# Patient Record
Sex: Female | Born: 1970 | Race: Black or African American | Hispanic: No | Marital: Single | Smoking: Current some day smoker
Health system: Southern US, Community
[De-identification: ages and names within clinical notes are randomized; demographics above are authoritative.]

## PROBLEM LIST (undated history)

## (undated) DIAGNOSIS — N83209 Unspecified ovarian cyst, unspecified side: Secondary | ICD-10-CM

## (undated) DIAGNOSIS — Z9289 Personal history of other medical treatment: Secondary | ICD-10-CM

## (undated) DIAGNOSIS — F419 Anxiety disorder, unspecified: Secondary | ICD-10-CM

## (undated) DIAGNOSIS — R011 Cardiac murmur, unspecified: Secondary | ICD-10-CM

## (undated) DIAGNOSIS — I10 Essential (primary) hypertension: Secondary | ICD-10-CM

## (undated) DIAGNOSIS — J45909 Unspecified asthma, uncomplicated: Secondary | ICD-10-CM

## (undated) HISTORY — PX: WISDOM TOOTH EXTRACTION: SHX21

## (undated) HISTORY — PX: ABDOMINAL HYSTERECTOMY: SHX81

## (undated) HISTORY — PX: APPENDECTOMY: SHX54

---

## 2013-04-20 ENCOUNTER — Emergency Department (HOSPITAL_COMMUNITY)
Admission: EM | Admit: 2013-04-20 | Discharge: 2013-04-20 | Disposition: A | Discharge status: Home / Self Care | Payer: Self-pay | Attending: Emergency Medicine | Admitting: Emergency Medicine

## 2013-04-20 ENCOUNTER — Emergency Department (HOSPITAL_COMMUNITY): Payer: Self-pay

## 2013-04-20 ENCOUNTER — Encounter (HOSPITAL_COMMUNITY): Payer: Self-pay | Admitting: Emergency Medicine

## 2013-04-20 DIAGNOSIS — I10 Essential (primary) hypertension: Secondary | ICD-10-CM | POA: Insufficient documentation

## 2013-04-20 DIAGNOSIS — M545 Low back pain, unspecified: Secondary | ICD-10-CM | POA: Insufficient documentation

## 2013-04-20 DIAGNOSIS — R011 Cardiac murmur, unspecified: Secondary | ICD-10-CM | POA: Insufficient documentation

## 2013-04-20 DIAGNOSIS — J45901 Unspecified asthma with (acute) exacerbation: Secondary | ICD-10-CM | POA: Insufficient documentation

## 2013-04-20 DIAGNOSIS — I252 Old myocardial infarction: Secondary | ICD-10-CM | POA: Insufficient documentation

## 2013-04-20 DIAGNOSIS — R071 Chest pain on breathing: Secondary | ICD-10-CM | POA: Insufficient documentation

## 2013-04-20 DIAGNOSIS — R0789 Other chest pain: Secondary | ICD-10-CM

## 2013-04-20 DIAGNOSIS — F17200 Nicotine dependence, unspecified, uncomplicated: Secondary | ICD-10-CM | POA: Insufficient documentation

## 2013-04-20 HISTORY — DX: Cardiac murmur, unspecified: R01.1

## 2013-04-20 HISTORY — DX: Essential (primary) hypertension: I10

## 2013-04-20 HISTORY — DX: Unspecified asthma, uncomplicated: J45.909

## 2013-04-20 LAB — CBC
HCT: 41.8 % (ref 36.0–46.0)
Hemoglobin: 14.3 g/dL (ref 12.0–15.0)
MCHC: 34.2 g/dL (ref 30.0–36.0)
Platelets: 195 10*3/uL (ref 150–400)
RBC: 4.66 MIL/uL (ref 3.87–5.11)
RDW: 13.8 % (ref 11.5–15.5)
WBC: 9.7 10*3/uL (ref 4.0–10.5)

## 2013-04-20 LAB — POCT I-STAT TROPONIN I

## 2013-04-20 LAB — COMPREHENSIVE METABOLIC PANEL
ALT: 7 U/L (ref 0–35)
Albumin: 3.8 g/dL (ref 3.5–5.2)
Alkaline Phosphatase: 65 U/L (ref 39–117)
BUN: 7 mg/dL (ref 6–23)
Chloride: 103 mEq/L (ref 96–112)
Glucose, Bld: 88 mg/dL (ref 70–99)
Potassium: 3.3 mEq/L — ABNORMAL LOW (ref 3.5–5.1)
Sodium: 140 mEq/L (ref 135–145)
Total Bilirubin: 0.3 mg/dL (ref 0.3–1.2)

## 2013-04-20 LAB — D-DIMER, QUANTITATIVE: D-Dimer, Quant: <0.27 ug/mL-FEU (ref 0.00–0.48)

## 2013-04-20 MED ORDER — DEXAMETHASONE SODIUM PHOSPHATE 4 MG/ML IJ SOLN
10.0000 mg | Freq: Once | INTRAMUSCULAR | Status: AC
  Administered 2013-04-20: 10 mg via INTRAVENOUS
  Filled 2013-04-20 (×2): qty 3

## 2013-04-20 MED ORDER — PREDNISONE 20 MG PO TABS: ORAL_TABLET | ORAL | Status: DC

## 2013-04-20 MED ORDER — TRAMADOL HCL 50 MG PO TABS: 50.0000 mg | ORAL_TABLET | Freq: Four times a day (QID) | ORAL | Status: DC | PRN

## 2013-04-20 MED ORDER — ACETAMINOPHEN 325 MG PO TABS
650.0000 mg | ORAL_TABLET | Freq: Once | ORAL | Status: AC
  Administered 2013-04-20: 650 mg via ORAL
  Filled 2013-04-20: qty 2

## 2013-04-20 MED ORDER — METHOCARBAMOL 500 MG PO TABS
1000.0000 mg | ORAL_TABLET | Freq: Once | ORAL | Status: AC
  Administered 2013-04-20: 1000 mg via ORAL
  Filled 2013-04-20: qty 2

## 2013-04-20 MED ORDER — CYCLOBENZAPRINE HCL 10 MG PO TABS
10.0000 mg | ORAL_TABLET | Freq: Once | ORAL | Status: DC
  Filled 2013-04-20: qty 1

## 2013-04-20 MED ORDER — METHOCARBAMOL 500 MG PO TABS: ORAL_TABLET | ORAL | Status: DC

## 2013-04-20 NOTE — ED Provider Notes (Addendum)
CSN: [id number]     Arrival date & time 04/20/13  1307 History   First MD Initiated Contact with Patient 04/20/13 1308     Chief Complaint  Patient presents with  . Chest Pain   (Consider location/radiation/quality/duration/timing/severity/associated sxs/prior Treatment) HPI Patient reports last night she started getting an upper chest pain that has been constant since last night and has not gone away. She states it's aching and stabbing in nature. She states deep breathing and sometimes movement of her arms makes the pain worse. Nothing makes it feel better. She states she has mild shortness of breath. She had a dry cough last night but not today. She states she felt hot during the night and thought maybe she had a fever. She's been having some rhinorrhea that is white and sometimes green. She denies nausea, vomiting, diaphoresis, sore throat. She states her pain about an hour before she came to the ED was a 10 out of 10, after EMS gave her nitroglycerin x3 her pain came down to a 7/10. She states it feels like when she had MI 3 years ago. She reports however when she had her MI no cardiac catheterization was done and she did not have a stent placed. She states they described it as "slight heart attack" and thought it was from stress to to some problems she was having with a grown daughter.  Patient states she moves her month ago for further evaluation of some low back pain that she's been having. She states she is not gotten to see a doctor since she moved here. She states she was given hydrocodone however she doesn't take it "because it doesn't help".  Patient states she thinks her father started having heart disease while he was in his 66s. He is currently deceased. She states her mother has hypertension and breast cancer.   PCP none  Past Medical History  Diagnosis Date  . MI (myocardial infarction)   . Heart murmur   . Asthma   . Hypertension    Past Surgical History  Procedure  Laterality Date  . Appendectomy    . Cesarean section    . Abdominal hysterectomy     History reviewed. No pertinent family history. History  Substance Use Topics  . Smoking status: Current Every Day Smoker -- 0.50 packs/day    Types: Cigarettes  . Smokeless tobacco: Never Used  . Alcohol Use: No   Unemployed Moved from [employer] 1 month ago Denies cocaine use  OB History   Grav Para Term Preterm Abortions TAB SAB Ect Mult Living                 Review of Systems  All other systems reviewed and are negative.    Allergies  Asa Pt also states she is unable to take ibuprofen, advil  Home Medications    Clonidine  Lisinopril HCTZ   BP 131/88  Pulse 71  Temp(Src) 98.8 F (37.1 C) (Oral)  Resp 22  SpO2 97%  Vital signs normal   Physical Exam  Nursing note and vitals reviewed. Constitutional: She is oriented to person, place, and time. She appears well-developed and well-nourished.  Non-toxic appearance. She does not appear ill. No distress.  HENT:  Head: Normocephalic and atraumatic.  Right Ear: External ear normal.  Left Ear: External ear normal.  Nose: Nose normal. No mucosal edema or rhinorrhea.  Mouth/Throat: Oropharynx is clear and moist and mucous membranes are normal. No dental abscesses or uvula swelling.  Eyes: Conjunctivae  and EOM are normal. Pupils are equal, round, and reactive to light.  Neck: Normal range of motion and full passive range of motion without pain. Neck supple.  Cardiovascular: Normal rate, regular rhythm and normal heart sounds.  Exam reveals no gallop and no friction rub.   No murmur heard. Pulmonary/Chest: Effort normal and breath sounds normal. No respiratory distress. She has no wheezes. She has no rhonchi. She has no rales. She exhibits tenderness. She exhibits no crepitus.    Tender to palpation over her upper bilateral chest that reproduces her pain  Abdominal: Soft. Normal appearance and bowel sounds are normal. She exhibits no  distension. There is no tenderness. There is no rebound and no guarding.  Musculoskeletal: Normal range of motion. She exhibits no edema and no tenderness.  Moves all extremities well.   Neurological: She is alert and oriented to person, place, and time. She has normal strength. No cranial nerve deficit.  Skin: Skin is warm, dry and intact. No rash noted. No erythema. No pallor.  Psychiatric: She has a normal mood and affect. Her speech is normal and behavior is normal. Her mood appears not anxious.    ED Course  Procedures (including critical care time)  Medications  cyclobenzaprine (FLEXERIL) tablet 10 mg (10 mg Oral Not Given 04/20/13 1415)  acetaminophen (TYLENOL) tablet 650 mg (650 mg Oral Given 04/20/13 1411)  dexamethasone (DECADRON) injection 10 mg (10 mg Intravenous Given 04/20/13 1411)  methocarbamol (ROBAXIN) tablet 1,000 mg (1,000 mg Oral Given 04/20/13 1506)    Review of the [place] shows patient has had 9 hydrocodone prescriptions written since May. 3 were 4 #60 tablets including October 2. The others were 4 areas amount from 12-20. They were written from the emergency room at [facility], a physician in [place], [place], a physician in [place], [place], a physician from [facility], and a physician from [place], [place].  We were unable to get her hospital records from [facility], the nurse was told she was admitted in 2012 for back pain and medical records is closed on the weekend. Pt states she was admitted for her back and her chest.   PT's pain is better with above medications.   Pt is asking her nurse at discharge to get hydrocodone 10 mg or percocet for her pain. Pt is displaying drug seeking behavior.  Labs Review Results for orders placed during the hospital encounter of 04/20/13  CBC      Result Value Range   WBC 9.7  4.0 - 10.5 K/uL   RBC 4.66  3.87 - 5.11 MIL/uL   Hemoglobin 14.3  12.0 - 15.0  g/dL   HCT 41.3  24.4 - 01.0 %   MCV 89.7  78.0 - 100.0 fL   MCH 30.7  26.0 - 34.0 pg   MCHC 34.2  30.0 - 36.0 g/dL   RDW 27.2  53.6 - 64.4 %   Platelets 195  150 - 400 K/uL  COMPREHENSIVE METABOLIC PANEL      Result Value Range   Sodium 140  135 - 145 mEq/L   Potassium 3.3 (*) 3.5 - 5.1 mEq/L   Chloride 103  96 - 112 mEq/L   CO2 28  19 - 32 mEq/L   Glucose, Bld 88  70 - 99 mg/dL   BUN 7  6 - 23 mg/dL   Creatinine, Ser 0.34  0.50 - 1.10 mg/dL   Calcium 8.8  8.4 - 74.2 mg/dL  Total Protein 7.1  6.0 - 8.3 g/dL   Albumin 3.8  3.5 - 5.2 g/dL   AST 11  0 - 37 U/L   ALT 7  0 - 35 U/L   Alkaline Phosphatase 65  39 - 117 U/L   Total Bilirubin 0.3  0.3 - 1.2 mg/dL   GFR calc non Af Amer >90  >90 mL/min   GFR calc Af Amer >90  >90 mL/min  D-DIMER, QUANTITATIVE      Result Value Range   D-Dimer, Quant <0.27  0.00 - 0.48 ug/mL-FEU  POCT I-STAT TROPONIN I      Result Value Range   Troponin i, poc 0.02  0.00 - 0.08 ng/mL   Comment 3           POCT I-STAT TROPONIN I      Result Value Range   Troponin i, poc 0.02  0.00 - 0.08 ng/mL   Comment 3            Laboratory interpretation all normal except mild hypokalemia      Imaging Review Dg Chest 2 View  04/20/2013   CLINICAL DATA:  Chest pain  EXAM: CHEST  2 VIEW  COMPARISON:  None.  FINDINGS: Lungs are clear. Heart size and pulmonary vascularity are normal. No adenopathy. No pneumothorax. No bone lesions.  IMPRESSION: No abnormality noted.   Electronically Signed   By: Bretta Bang M.D.   On: 04/20/2013 13:59    EKG Interpretation     Ventricular Rate:  79 PR Interval:  133 QRS Duration: 92 QT Interval:  425 QTC Calculation: 487 R Axis:   -7 Text Interpretation:  Sinus rhythm Probable left atrial enlargement LVH with secondary repolarization abnormality Anterior Q waves, possibly due to LVH No old tracing to compare            MDM   1. Chest wall pain     New Prescriptions   METHOCARBAMOL (ROBAXIN) 500 MG  TABLET    Take 1000 mg 4 times a day   PREDNISONE (DELTASONE) 20 MG TABLET    Take 3 po QD x 2d starting tomorrow, then 2 po QD x 3d then 1 po QD x 3d   TRAMADOL (ULTRAM) 50 MG TABLET    Take 1 tablet (50 mg total) by mouth every 6 (six) hours as needed for pain.    Plan discharge   Devoria Albe, MD, Franz Dell, MD 04/20/13 0981  Ward Givens, MD 04/20/13 1914  Ward Givens, MD 04/20/13 7829

## 2013-04-20 NOTE — ED Notes (Signed)
Pt reports to the ED for eval of generalized chest tightness and SOB. Pt was hypertensive PTA but pt reports that she has hx of hypertension and her CP is always high. Pt had 3 nitros PTA. No ASA was given because patient is allergic to ASA. Pt reports the chest tightness feels similar to symptoms she had 3 years ago when she had an MI. Lung sounds clear, skin warm and dry, and resp e/u. Pain 10/10 upon EMS arrival. After 3 nitros pain 7/10. 12 lead was unremarkable per EMS.

## 2013-05-19 ENCOUNTER — Emergency Department (HOSPITAL_COMMUNITY): Payer: Self-pay

## 2013-05-19 ENCOUNTER — Emergency Department (HOSPITAL_COMMUNITY)
Admission: EM | Admit: 2013-05-19 | Discharge: 2013-05-19 | Disposition: A | Discharge status: Home / Self Care | Payer: Self-pay | Attending: Emergency Medicine | Admitting: Emergency Medicine

## 2013-05-19 ENCOUNTER — Encounter (HOSPITAL_COMMUNITY): Payer: Self-pay | Admitting: Emergency Medicine

## 2013-05-19 DIAGNOSIS — R011 Cardiac murmur, unspecified: Secondary | ICD-10-CM | POA: Insufficient documentation

## 2013-05-19 DIAGNOSIS — Y929 Unspecified place or not applicable: Secondary | ICD-10-CM | POA: Insufficient documentation

## 2013-05-19 DIAGNOSIS — IMO0002 Reserved for concepts with insufficient information to code with codable children: Secondary | ICD-10-CM | POA: Insufficient documentation

## 2013-05-19 DIAGNOSIS — S93409A Sprain of unspecified ligament of unspecified ankle, initial encounter: Secondary | ICD-10-CM | POA: Insufficient documentation

## 2013-05-19 DIAGNOSIS — S93401A Sprain of unspecified ligament of right ankle, initial encounter: Secondary | ICD-10-CM

## 2013-05-19 DIAGNOSIS — F17200 Nicotine dependence, unspecified, uncomplicated: Secondary | ICD-10-CM | POA: Insufficient documentation

## 2013-05-19 DIAGNOSIS — Y9389 Activity, other specified: Secondary | ICD-10-CM | POA: Insufficient documentation

## 2013-05-19 DIAGNOSIS — J45909 Unspecified asthma, uncomplicated: Secondary | ICD-10-CM | POA: Insufficient documentation

## 2013-05-19 DIAGNOSIS — I10 Essential (primary) hypertension: Secondary | ICD-10-CM | POA: Insufficient documentation

## 2013-05-19 DIAGNOSIS — I252 Old myocardial infarction: Secondary | ICD-10-CM | POA: Insufficient documentation

## 2013-05-19 DIAGNOSIS — X500XXA Overexertion from strenuous movement or load, initial encounter: Secondary | ICD-10-CM | POA: Insufficient documentation

## 2013-05-19 MED ORDER — OXYCODONE-ACETAMINOPHEN 5-325 MG PO TABS: 2.0000 | ORAL_TABLET | Freq: Four times a day (QID) | ORAL | Status: DC | PRN

## 2013-05-19 NOTE — ED Notes (Signed)
Per pt sts right ankle pain. denies injury. Hx of old injury

## 2013-05-19 NOTE — Progress Notes (Signed)
Orthopedic Tech Progress Note Patient Details:  Jill Wright [birth year] [id number]  Ortho Devices Type of Ortho Device: ASO;Crutches Ortho Device/Splint Location: rle Ortho Device/Splint Interventions: Application   Nikki Dom 05/19/2013, 3:49 PM

## 2013-05-19 NOTE — ED Provider Notes (Signed)
CSN: [id number]     Arrival date & time 05/19/13  1323 History  This chart was scribed for non-physician practitioner, Roxy Horseman, PA-C working with Roney Marion, MD by Greggory Stallion, ED scribe. This patient was seen in room TR04C/TR04C and the patient's care was started at 3:25 PM.   Chief Complaint  Patient presents with  . Ankle Pain   The history is provided by the patient. No language interpreter was used.   HPI Comments: Jill Wright is a 42 y.o. female who presents to the Emergency Department complaining of gradual onset, constant right ankle pain with associated swelling that started 2 days ago. She thinks she twisted her ankle while going down the steps. Pt has used ice and taken Tylenol with no relief. Bearing weight worsens the pain. Pt states she has to walk on her tip toes due to the pain. She has broken her right ankle in the past.    Past Medical History  Diagnosis Date  . MI (myocardial infarction)   . Heart murmur   . Asthma   . Hypertension    Past Surgical History  Procedure Laterality Date  . Appendectomy    . Cesarean section    . Abdominal hysterectomy     History reviewed. No pertinent family history. History  Substance Use Topics  . Smoking status: Current Every Day Smoker -- 0.50 packs/day    Types: Cigarettes  . Smokeless tobacco: Never Used  . Alcohol Use: No   OB History   Grav Para Term Preterm Abortions TAB SAB Ect Mult Living                 Review of Systems A complete 10 system review of systems was obtained and all systems are negative except as noted in the HPI and PMH.   Allergies  Asa  Home Medications   Current Outpatient Rx  Name  Route  Sig  Dispense  Refill  . methocarbamol (ROBAXIN) 500 MG tablet      Take 1000 mg 4 times a day   60 tablet   0   . predniSONE (DELTASONE) 20 MG tablet      Take 3 po QD x 2d starting tomorrow, then 2 po QD x 3d then 1 po QD x 3d   15 tablet   0   . traMADol (ULTRAM) 50 MG  tablet   Oral   Take 1 tablet (50 mg total) by mouth every 6 (six) hours as needed for pain.   16 tablet   0    BP 181/112  Pulse 75  Temp(Src) 97.6 F (36.4 C)  Resp 18  Ht 6\' 1"  (1.854 m)  Wt 240 lb (108.863 kg)  BMI 31.67 kg/m2  SpO2 98%  Physical Exam  Nursing note and vitals reviewed. Constitutional: She is oriented to person, place, and time. She appears well-developed and well-nourished. No distress.  HENT:  Head: Normocephalic and atraumatic.  Eyes: EOM are normal.  Neck: Neck supple. No tracheal deviation present.  Cardiovascular: Normal rate and intact distal pulses.   Brisk capillary refill.   Pulmonary/Chest: Effort normal. No respiratory distress.  Musculoskeletal: Normal range of motion.  Right ankle ROM and strength limited secondary to pain. Mild swelling.  Tenderness to palpation over the ATFL. No bony tenderness, deformity or abnormality.   Neurological: She is alert and oriented to person, place, and time.  Sensation intact.   Skin: Skin is warm and dry.  No erythema, warmth or  signs of infection.  Psychiatric: She has a normal mood and affect. Her behavior is normal.    ED Course  Procedures (including critical care time)  DIAGNOSTIC STUDIES: Oxygen Saturation is 98% on RA, normal by my interpretation.    COORDINATION OF CARE: 3:30 PM-Discussed treatment plan which includes ankle brace, crutches and pain medication with pt at bedside and pt agreed to plan. Advised pt to follow up with orthopedics if symptoms do not resolve.   Labs Review Labs Reviewed - No data to display Imaging Review Dg Ankle Complete Right  05/19/2013   CLINICAL DATA:  History of fall  EXAM: RIGHT ANKLE - COMPLETE 3+ VIEW  COMPARISON:  None.  FINDINGS: There is no evidence of fracture, dislocation, or joint effusion. There is no evidence of arthropathy or other focal bone abnormality. Soft tissues are unremarkable.  IMPRESSION: Negative.   Electronically Signed   By: Salome Holmes M.D.   On: 05/19/2013 14:31    EKG Interpretation   None       MDM   1. Ankle sprain, right, initial encounter    Patient with ankle sprain.  Plain films are negative.  Will give ankle brace, crutches, and pain meds.  Ortho follow-up.  Patient is able to ambulate, but it is painful.   I personally performed the services described in this documentation, which was scribed in my presence. The recorded information has been reviewed and is accurate.    Roxy Horseman, PA-C 05/19/13 [phone]

## 2013-05-22 NOTE — ED Provider Notes (Signed)
Medical screening examination/treatment/procedure(s) were performed by non-physician practitioner and as supervising physician I was immediately available for consultation/collaboration.  EKG Interpretation   None         Roney Marion, MD 05/22/13 [phone]

## 2013-07-14 ENCOUNTER — Encounter (HOSPITAL_COMMUNITY): Payer: Self-pay | Admitting: Emergency Medicine

## 2013-07-14 ENCOUNTER — Emergency Department (HOSPITAL_COMMUNITY)
Admission: EM | Admit: 2013-07-14 | Discharge: 2013-07-14 | Disposition: A | Discharge status: Home / Self Care | Payer: Self-pay | Attending: Emergency Medicine | Admitting: Emergency Medicine

## 2013-07-14 DIAGNOSIS — I252 Old myocardial infarction: Secondary | ICD-10-CM | POA: Insufficient documentation

## 2013-07-14 DIAGNOSIS — G8911 Acute pain due to trauma: Secondary | ICD-10-CM | POA: Insufficient documentation

## 2013-07-14 DIAGNOSIS — J45909 Unspecified asthma, uncomplicated: Secondary | ICD-10-CM | POA: Insufficient documentation

## 2013-07-14 DIAGNOSIS — F17200 Nicotine dependence, unspecified, uncomplicated: Secondary | ICD-10-CM | POA: Insufficient documentation

## 2013-07-14 DIAGNOSIS — M5431 Sciatica, right side: Secondary | ICD-10-CM

## 2013-07-14 DIAGNOSIS — Z8781 Personal history of (healed) traumatic fracture: Secondary | ICD-10-CM | POA: Insufficient documentation

## 2013-07-14 DIAGNOSIS — M5430 Sciatica, unspecified side: Secondary | ICD-10-CM | POA: Insufficient documentation

## 2013-07-14 DIAGNOSIS — M25571 Pain in right ankle and joints of right foot: Secondary | ICD-10-CM

## 2013-07-14 DIAGNOSIS — R011 Cardiac murmur, unspecified: Secondary | ICD-10-CM | POA: Insufficient documentation

## 2013-07-14 DIAGNOSIS — G8929 Other chronic pain: Secondary | ICD-10-CM | POA: Insufficient documentation

## 2013-07-14 DIAGNOSIS — I10 Essential (primary) hypertension: Secondary | ICD-10-CM | POA: Insufficient documentation

## 2013-07-14 DIAGNOSIS — Z79899 Other long term (current) drug therapy: Secondary | ICD-10-CM | POA: Insufficient documentation

## 2013-07-14 DIAGNOSIS — M25579 Pain in unspecified ankle and joints of unspecified foot: Secondary | ICD-10-CM | POA: Insufficient documentation

## 2013-07-14 MED ORDER — CYCLOBENZAPRINE HCL 10 MG PO TABS: 10.0000 mg | ORAL_TABLET | Freq: Two times a day (BID) | ORAL | Status: DC | PRN

## 2013-07-14 MED ORDER — TRAMADOL HCL 50 MG PO TABS
50.0000 mg | ORAL_TABLET | Freq: Once | ORAL | Status: DC
  Filled 2013-07-14: qty 1

## 2013-07-14 MED ORDER — TRAMADOL HCL 50 MG PO TABS: 50.0000 mg | ORAL_TABLET | Freq: Four times a day (QID) | ORAL | Status: DC | PRN

## 2013-07-14 MED ORDER — CYCLOBENZAPRINE HCL 10 MG PO TABS
10.0000 mg | ORAL_TABLET | Freq: Once | ORAL | Status: AC
  Administered 2013-07-14: 10 mg via ORAL
  Filled 2013-07-14: qty 1

## 2013-07-14 NOTE — ED Notes (Signed)
Pt reports she has chronic back pain that started again a couple of days ago. Also has pain to the right ankle.

## 2013-07-14 NOTE — ED Notes (Signed)
Patient states she sprained her right ankle a while ago and usually has to wear a boot on it. The boot she had is broken.

## 2013-07-14 NOTE — Discharge Instructions (Signed)
Arthralgia  Arthralgia is joint pain. A joint is a place where two bones meet. Joint pain can happen for many reasons. The joint can be bruised, stiff, infected, or weak from aging. Pain usually goes away after resting and taking medicine for soreness.   HOME CARE  · Rest the joint as told by your doctor.  · Keep the sore joint raised (elevated) for the first 24 hours.  · Put ice on the joint area.  · Put ice in a plastic bag.  · Place a towel between your skin and the bag.  · Leave the ice on for 15-20 minutes, 03-04 times a day.  · Wear your splint, casting, elastic bandage, or sling as told by your doctor.  · Only take medicine as told by your doctor. Do not take aspirin.  · Use crutches as told by your doctor. Do not put weight on the joint until told to by your doctor.  GET HELP RIGHT AWAY IF:   · You have bruising, puffiness (swelling), or more pain.  · Your fingers or toes turn blue or start to lose feeling (numb).  · Your medicine does not lessen the pain.  · Your pain becomes severe.  · You have a temperature by mouth above 102° F (38.9° C), not controlled by medicine.  · You cannot move or use the joint.  MAKE SURE YOU:   · Understand these instructions.  · Will watch your condition.  · Will get help right away if you are not doing well or get worse.  Document Released: 06/01/2009 Document Revised: 09/05/2011 Document Reviewed: 06/01/2009  [employer]® Patient Information ©2014 [employer].

## 2013-07-14 NOTE — ED Notes (Signed)
Patient has slight swelling to the lateral ankle with decrease in movement due to pain.

## 2013-07-14 NOTE — ED Provider Notes (Signed)
CSN: [id number]     Arrival date & time 07/14/13  1701 History  This chart was scribed for non-physician practitioner Junius FinnerErin O'Malley, PA-C working with Laray AngerKathleen M McManus, DO by Donne Anonayla Curran, ED Scribe. This patient was seen in room TR09C/TR09C and the patient's care was started at 1735.    First MD Initiated Contact with Patient 07/14/13 1735     Chief Complaint  Patient presents with  . Back Pain  . Ankle Pain    The history is provided by the patient. No language interpreter was used.   HPI Comments: Jill CoilMonique L Wright is a 43 y.o. female who presents to the Emergency Department complaining of a year and a half of gradual onset, chronic, intermittent, severe (9/10) lower back pain that radiates down to her right foot with the latest flare up occuring 3 days ago. She also complains of 3 days of gradual onset, intermittent severe right ankle pain. She sprained her right ankle in November and reports the pain has intermittently persisted since. She denies fever, nausea, vomiting, numbness or tingling in her extremities or any other pain. She has not tried any OTC medication. She has not seen an orthopedist for her symptoms.   Past Medical History  Diagnosis Date  . MI (myocardial infarction)   . Heart murmur   . Asthma   . Hypertension    Past Surgical History  Procedure Laterality Date  . Appendectomy    . Cesarean section    . Abdominal hysterectomy     History reviewed. No pertinent family history. History  Substance Use Topics  . Smoking status: Current Every Day Smoker -- 0.50 packs/day    Types: Cigarettes  . Smokeless tobacco: Never Used  . Alcohol Use: No   OB History   Grav Para Term Preterm Abortions TAB SAB Ect Mult Living                 Review of Systems  Constitutional: Negative for fever.  Gastrointestinal: Negative for nausea and vomiting.  Musculoskeletal: Positive for arthralgias and back pain.  Neurological: Negative for numbness.  All other systems  reviewed and are negative.    Allergies  Asa and Ultram  Home Medications   Current Outpatient Rx  Name  Route  Sig  Dispense  Refill  . albuterol (PROVENTIL HFA;VENTOLIN HFA) 108 (90 BASE) MCG/ACT inhaler   Inhalation   Inhale 2 puffs into the lungs every 6 (six) hours as needed for wheezing or shortness of breath.         . cloNIDine (CATAPRES) 0.2 MG tablet   Oral   Take 0.2 mg by mouth 2 (two) times daily.         . cyclobenzaprine (FLEXERIL) 10 MG tablet   Oral   Take 1 tablet (10 mg total) by mouth 2 (two) times daily as needed for muscle spasms.   20 tablet   0   . traMADol (ULTRAM) 50 MG tablet   Oral   Take 1 tablet (50 mg total) by mouth every 6 (six) hours as needed.   15 tablet   0    BP 197/112  Pulse 66  Temp(Src) 98.3 F (36.8 C) (Oral)  Resp 16  SpO2 100%  Physical Exam  Nursing note and vitals reviewed. Constitutional: She is oriented to person, place, and time. She appears well-developed and well-nourished.  HENT:  Head: Normocephalic and atraumatic.  Eyes: EOM are normal.  Neck: Normal range of motion.  Cardiovascular: Normal rate.  Pulmonary/Chest: Effort normal.  Musculoskeletal: Normal range of motion. She exhibits tenderness.  Tender to palpation of right lumbar paraspinal musculature and right buttock. Normal gait. No bony tenderness, step offs or crepitance.   Neurological: She is alert and oriented to person, place, and time.  Skin: Skin is warm and dry.  Psychiatric: She has a normal mood and affect. Her behavior is normal.    ED Course  Procedures (including critical care time) DIAGNOSTIC STUDIES: Oxygen Saturation is 98% on RA, normal by my interpretation.    COORDINATION OF CARE: 6:34 PM Discussed treatment plan which includes Flexeril and Tramadol with pt at bedside and pt agreed to plan. Resource guide given. Advised pt to follow up with orthopedist, and orthopedic referral given.    Labs Review Labs Reviewed - No  data to display Imaging Review No results found.  EKG Interpretation   None       MDM   1. Back pain with right-sided sciatica   2. Right ankle pain    Hx of chronic pain and right ankle fx in Nov. 2014. No ortho f/u as advised. No new injuries. No new trauma. Do not believe imaging needed at this time. Advised pt to f/u with PCP and ortho. ACE wrap placed, work note provided.  I personally performed the services described in this documentation, which was scribed in my presence. The recorded information has been reviewed and is accurate.    Junius Finner, PA-C 07/14/13 2320

## 2013-07-15 NOTE — ED Provider Notes (Signed)
Medical screening examination/treatment/procedure(s) were performed by non-physician practitioner and as supervising physician I was immediately available for consultation/collaboration.  EKG Interpretation   None         Davielle Lingelbach M Shadi Sessler, DO 07/15/13 1145 

## 2013-07-30 ENCOUNTER — Encounter (HOSPITAL_COMMUNITY): Payer: Self-pay | Admitting: Emergency Medicine

## 2013-07-30 ENCOUNTER — Emergency Department (HOSPITAL_COMMUNITY)
Admission: EM | Admit: 2013-07-30 | Discharge: 2013-07-30 | Disposition: A | Discharge status: Home / Self Care | Payer: Self-pay | Attending: Emergency Medicine | Admitting: Emergency Medicine

## 2013-07-30 ENCOUNTER — Emergency Department (HOSPITAL_COMMUNITY): Payer: Self-pay

## 2013-07-30 DIAGNOSIS — I10 Essential (primary) hypertension: Secondary | ICD-10-CM | POA: Insufficient documentation

## 2013-07-30 DIAGNOSIS — Z79899 Other long term (current) drug therapy: Secondary | ICD-10-CM | POA: Insufficient documentation

## 2013-07-30 DIAGNOSIS — Z90710 Acquired absence of both cervix and uterus: Secondary | ICD-10-CM | POA: Insufficient documentation

## 2013-07-30 DIAGNOSIS — F17200 Nicotine dependence, unspecified, uncomplicated: Secondary | ICD-10-CM | POA: Insufficient documentation

## 2013-07-30 DIAGNOSIS — A599 Trichomoniasis, unspecified: Secondary | ICD-10-CM | POA: Insufficient documentation

## 2013-07-30 DIAGNOSIS — R011 Cardiac murmur, unspecified: Secondary | ICD-10-CM | POA: Insufficient documentation

## 2013-07-30 DIAGNOSIS — Z9089 Acquired absence of other organs: Secondary | ICD-10-CM | POA: Insufficient documentation

## 2013-07-30 DIAGNOSIS — J45909 Unspecified asthma, uncomplicated: Secondary | ICD-10-CM | POA: Insufficient documentation

## 2013-07-30 DIAGNOSIS — N83209 Unspecified ovarian cyst, unspecified side: Secondary | ICD-10-CM | POA: Insufficient documentation

## 2013-07-30 DIAGNOSIS — I252 Old myocardial infarction: Secondary | ICD-10-CM | POA: Insufficient documentation

## 2013-07-30 LAB — COMPREHENSIVE METABOLIC PANEL
ALT: 8 U/L (ref 0–35)
AST: 11 U/L (ref 0–37)
Albumin: 3.5 g/dL (ref 3.5–5.2)
Alkaline Phosphatase: 63 U/L (ref 39–117)
BUN: 9 mg/dL (ref 6–23)
CO2: 23 mEq/L (ref 19–32)
Calcium: 8.5 mg/dL (ref 8.4–10.5)
Chloride: 102 mEq/L (ref 96–112)
Creatinine, Ser: 0.48 mg/dL — ABNORMAL LOW (ref 0.50–1.10)
GFR calc non Af Amer: >90 mL/min (ref >90)
GLUCOSE: 116 mg/dL — AB (ref 70–99)
Potassium: 3.6 mEq/L — ABNORMAL LOW (ref 3.7–5.3)
SODIUM: 139 meq/L (ref 137–147)
TOTAL PROTEIN: 7 g/dL (ref 6.0–8.3)
Total Bilirubin: <0.2 mg/dL — ABNORMAL LOW (ref 0.3–1.2)

## 2013-07-30 LAB — URINALYSIS, ROUTINE W REFLEX MICROSCOPIC
Bilirubin Urine: NEGATIVE
Glucose, UA: NEGATIVE mg/dL
Ketones, ur: NEGATIVE mg/dL
NITRITE: NEGATIVE
Protein, ur: 100 mg/dL — AB
SPECIFIC GRAVITY, URINE: 1.026 (ref 1.005–1.030)
UROBILINOGEN UA: 1 mg/dL (ref 0.0–1.0)
pH: 6 (ref 5.0–8.0)

## 2013-07-30 LAB — URINE MICROSCOPIC-ADD ON

## 2013-07-30 LAB — CBC WITH DIFFERENTIAL/PLATELET
Basophils Absolute: 0.1 10*3/uL (ref 0.0–0.1)
Basophils Relative: 1 % (ref 0–1)
EOS ABS: 0.2 10*3/uL (ref 0.0–0.7)
EOS PCT: 2 % (ref 0–5)
HCT: 40.9 % (ref 36.0–46.0)
Hemoglobin: 14.2 g/dL (ref 12.0–15.0)
LYMPHS ABS: 3.5 10*3/uL (ref 0.7–4.0)
Lymphocytes Relative: 35 % (ref 12–46)
MCH: 30.8 pg (ref 26.0–34.0)
MCHC: 34.7 g/dL (ref 30.0–36.0)
MCV: 88.7 fL (ref 78.0–100.0)
Monocytes Absolute: 0.7 10*3/uL (ref 0.1–1.0)
Monocytes Relative: 7 % (ref 3–12)
Neutro Abs: 5.6 10*3/uL (ref 1.7–7.7)
Neutrophils Relative %: 55 % (ref 43–77)
PLATELETS: 208 10*3/uL (ref 150–400)
RBC: 4.61 MIL/uL (ref 3.87–5.11)
RDW: 13.6 % (ref 11.5–15.5)
WBC: 10.1 10*3/uL (ref 4.0–10.5)

## 2013-07-30 LAB — LIPASE, BLOOD: LIPASE: 95 U/L — AB (ref 11–59)

## 2013-07-30 MED ORDER — OXYCODONE-ACETAMINOPHEN 5-325 MG PO TABS: 1.0000 | ORAL_TABLET | Freq: Four times a day (QID) | ORAL | Status: DC | PRN

## 2013-07-30 MED ORDER — ONDANSETRON HCL 4 MG/2ML IJ SOLN
4.0000 mg | Freq: Once | INTRAMUSCULAR | Status: AC
  Administered 2013-07-30: 4 mg via INTRAVENOUS
  Filled 2013-07-30: qty 2

## 2013-07-30 MED ORDER — ONDANSETRON HCL 4 MG PO TABS: 4.0000 mg | ORAL_TABLET | Freq: Four times a day (QID) | ORAL | Status: DC

## 2013-07-30 MED ORDER — HYDROMORPHONE HCL PF 1 MG/ML IJ SOLN
1.0000 mg | Freq: Once | INTRAMUSCULAR | Status: AC
  Administered 2013-07-30: 1 mg via INTRAVENOUS
  Filled 2013-07-30: qty 1

## 2013-07-30 NOTE — ED Provider Notes (Signed)
CSN: [id number]     Arrival date & time 07/30/13  0110 History   None    Chief Complaint  Patient presents with  . Abdominal Pain   (Consider location/radiation/quality/duration/timing/severity/associated sxs/prior Treatment) HPI Pt presents with right lower abdominal pain that radiates to her right flank.  She states pain started yesterday.  Denies dysuria, no increased urgency or frequency.  No fever/chills. Has had some nausea and vomiting.  Pt has prior appendectomy and hysterectomy.  No prior pain similar to this.  Has not tried anything for the pain.  Pain worse with palpation, worse with lying flat.  No vaginal bleeding.  There are no other associated systemic symptoms, there are no other alleviating or modifying factors.   Past Medical History  Diagnosis Date  . MI (myocardial infarction)   . Heart murmur   . Asthma   . Hypertension    Past Surgical History  Procedure Laterality Date  . Appendectomy    . Cesarean section    . Abdominal hysterectomy     No family history on file. History  Substance Use Topics  . Smoking status: Current Every Day Smoker -- 0.50 packs/day    Types: Cigarettes  . Smokeless tobacco: Never Used  . Alcohol Use: No   OB History   Grav Para Term Preterm Abortions TAB SAB Ect Mult Living                 Review of Systems ROS reviewed and all otherwise negative except for mentioned in HPI  Allergies  Asa and Ultram  Home Medications   Current Outpatient Rx  Name  Route  Sig  Dispense  Refill  . albuterol (PROVENTIL HFA;VENTOLIN HFA) 108 (90 BASE) MCG/ACT inhaler   Inhalation   Inhale 2 puffs into the lungs every 6 (six) hours as needed for wheezing or shortness of breath.         . cloNIDine (CATAPRES) 0.2 MG tablet   Oral   Take 0.2 mg by mouth 2 (two) times daily.         . cyclobenzaprine (FLEXERIL) 10 MG tablet   Oral   Take 1 tablet (10 mg total) by mouth 2 (two) times daily as needed for muscle spasms.   20 tablet    0   . traMADol (ULTRAM) 50 MG tablet   Oral   Take 1 tablet (50 mg total) by mouth every 6 (six) hours as needed.   15 tablet   0   . ondansetron (ZOFRAN) 4 MG tablet   Oral   Take 1 tablet (4 mg total) by mouth every 6 (six) hours.   12 tablet   0   . oxyCODONE-acetaminophen (PERCOCET/ROXICET) 5-325 MG per tablet   Oral   Take 1-2 tablets by mouth every 6 (six) hours as needed for severe pain.   15 tablet   0    BP 159/98  Pulse 66  Temp(Src) 97.8 F (36.6 C) (Oral)  Resp 18  Ht 6\' 1"  (1.854 m)  Wt 230 lb (104.327 kg)  BMI 30.35 kg/m2  SpO2 92% Vitals reviewed Physical Exam Physical Examination: General appearance - alert, well appearing, and in no distress Mental status - alert, oriented to person, place, and time Eyes - no conjunctival injection, no scleral icterus Mouth - mucous membranes moist, pharynx normal without lesions Chest - clear to auscultation, no wheezes, rales or rhonchi, symmetric air entry Heart - normal rate, regular rhythm, normal S1, S2, no murmurs, rubs, clicks or  gallops Abdomen - soft, ttp in right lower abdomen, no gaurding or rebound tenderness, nondistended, no masses or organomegaly Extremities - peripheral pulses normal, no pedal edema, no clubbing or cyanosis Skin - normal coloration and turgor, no rashes   ED Course  Procedures (including critical care time) Labs Review Labs Reviewed  COMPREHENSIVE METABOLIC PANEL - Abnormal; Notable for the following:    Potassium 3.6 (*)    Glucose, Bld 116 (*)    Creatinine, Ser 0.48 (*)    Total Bilirubin <0.2 (*)    All other components within normal limits  LIPASE, BLOOD - Abnormal; Notable for the following:    Lipase 95 (*)    All other components within normal limits  URINALYSIS, ROUTINE W REFLEX MICROSCOPIC - Abnormal; Notable for the following:    APPearance CLOUDY (*)    Hgb urine dipstick MODERATE (*)    Protein, ur 100 (*)    Leukocytes, UA MODERATE (*)    All other components  within normal limits  URINE MICROSCOPIC-ADD ON - Abnormal; Notable for the following:    Squamous Epithelial / LPF FEW (*)    Bacteria, UA FEW (*)    All other components within normal limits  URINE CULTURE  CBC WITH DIFFERENTIAL   Imaging Review Ct Abdomen Pelvis Wo Contrast  07/30/2013   CLINICAL DATA:  Right lower quadrant pain.  EXAM: CT ABDOMEN AND PELVIS WITHOUT CONTRAST  TECHNIQUE: Multidetector CT imaging of the abdomen and pelvis was performed following the standard protocol without intravenous contrast.  COMPARISON:  None.  FINDINGS: BODY WALL: Unremarkable.  LOWER CHEST: Unremarkable.  ABDOMEN/PELVIS:  Liver: No focal abnormality.  Biliary: No evidence of biliary obstruction or stone.  Pancreas: Unremarkable.  Spleen: Unremarkable.  Adrenals: Unremarkable.  Kidneys and ureters: 2 13 mm low dense renal lesions on the right. There are 2 similarly sized low dense renal lesions on the left, located in the upper and interpolar kidney. No hydronephrosis or stone.  Bladder: Unremarkable.  Reproductive: There is a 6 cm diameter mass in the right adnexa which has fluid hematocrit levels. The neighboring ovarian parenchyma does not appear thickened/edematous. Hysterectomy.  Bowel: No obstruction. Appendectomy.  Retroperitoneum: No mass or adenopathy.  Peritoneum: No free fluid or gas.  Vascular: No acute abnormality.  OSSEOUS: No acute abnormalities.  IMPRESSION: 1. 6 cm hemorrhagic cyst in the right adnexa. Short-interval follow up ultrasound in 6-12 weeks is recommended to confirm and document clearing. 2. Hysterectomy. 3. Bilateral renal cysts.   Electronically Signed   By: Tiburcio PeaJonathan  Watts M.D.   On: 07/30/2013 04:34    EKG Interpretation   None       MDM   1. Hemorrhagic ovarian cyst   2. Trichomonas infection    Pt presenting with right lower abdominal pain, pt s/p appendectomy and hysterectomy.  Urinalysis with some WBCs but appears contaminated.  Does show signs of trich.  Abdominal CT  scan to evaluated for stone, shows hemorrhagic ovarian cyst.  Pt feels improved after morphine.  Discharged with strict return precautions.  Pt agreeable with plan.    Ethelda ChickMartha K Linker, MD 07/30/13 [phone]

## 2013-07-30 NOTE — ED Notes (Signed)
Pt arrives via EMS c/o LRQ beginning 07/28/13. Pt unable to describe pain. Increases on palpation. Laying down makes it worse. Apendix has been removed. No pain on urination. Hx untreated HTN. 174/86 92.

## 2013-07-30 NOTE — ED Notes (Signed)
Pt taken to CT.

## 2013-07-30 NOTE — ED Notes (Signed)
Pt back from CT

## 2013-07-30 NOTE — Discharge Instructions (Signed)
Return to the ED with any concerns including fever/chills, vomiting and not able to keep down liquids, pain not controlled by pain medications, decreased level of alertness/lethargy, or any other alarming symptoms  It is important that you arrange to have a pelvic ultrasound performed in approx 6 weeks to ensure proper resolution of the ovarian cyst

## 2013-07-31 LAB — URINE CULTURE: Colony Count: 9000

## 2013-08-03 ENCOUNTER — Inpatient Hospital Stay (HOSPITAL_COMMUNITY): Payer: Self-pay

## 2013-08-03 ENCOUNTER — Inpatient Hospital Stay (HOSPITAL_COMMUNITY)
Admission: AD | Admit: 2013-08-03 | Discharge: 2013-08-03 | Disposition: A | Discharge status: Home / Self Care | Payer: Medicaid Other | Source: Ambulatory Visit | Attending: Obstetrics & Gynecology | Admitting: Obstetrics & Gynecology

## 2013-08-03 ENCOUNTER — Encounter (HOSPITAL_COMMUNITY): Payer: Self-pay | Admitting: *Deleted

## 2013-08-03 DIAGNOSIS — N83209 Unspecified ovarian cyst, unspecified side: Secondary | ICD-10-CM | POA: Insufficient documentation

## 2013-08-03 DIAGNOSIS — Z90711 Acquired absence of uterus with remaining cervical stump: Secondary | ICD-10-CM | POA: Insufficient documentation

## 2013-08-03 DIAGNOSIS — N83201 Unspecified ovarian cyst, right side: Secondary | ICD-10-CM

## 2013-08-03 LAB — URINE MICROSCOPIC-ADD ON

## 2013-08-03 LAB — URINALYSIS, ROUTINE W REFLEX MICROSCOPIC
Bilirubin Urine: NEGATIVE
Glucose, UA: NEGATIVE mg/dL
Ketones, ur: NEGATIVE mg/dL
LEUKOCYTES UA: NEGATIVE
Nitrite: NEGATIVE
PROTEIN: 30 mg/dL — AB
Specific Gravity, Urine: 1.025 (ref 1.005–1.030)
Urobilinogen, UA: 0.2 mg/dL (ref 0.0–1.0)
pH: 6 (ref 5.0–8.0)

## 2013-08-03 LAB — CBC
HCT: 39.8 % (ref 36.0–46.0)
Hemoglobin: 13.3 g/dL (ref 12.0–15.0)
MCH: 29.6 pg (ref 26.0–34.0)
MCHC: 33.4 g/dL (ref 30.0–36.0)
MCV: 88.4 fL (ref 78.0–100.0)
PLATELETS: 182 10*3/uL (ref 150–400)
RBC: 4.5 MIL/uL (ref 3.87–5.11)
RDW: 13.4 % (ref 11.5–15.5)
WBC: 12.5 10*3/uL — AB (ref 4.0–10.5)

## 2013-08-03 LAB — POCT PREGNANCY, URINE: Preg Test, Ur: NEGATIVE

## 2013-08-03 MED ORDER — OXYCODONE-ACETAMINOPHEN 5-325 MG PO TABS
1.0000 | ORAL_TABLET | Freq: Once | ORAL | Status: AC
  Administered 2013-08-03: 1 via ORAL
  Filled 2013-08-03: qty 1

## 2013-08-03 MED ORDER — OXYCODONE-ACETAMINOPHEN 5-325 MG PO TABS: 2.0000 | ORAL_TABLET | ORAL | Status: DC | PRN

## 2013-08-03 NOTE — Discharge Instructions (Signed)

## 2013-08-03 NOTE — MAU Note (Signed)
Patient presents stating that she went to the ER at [facility] Tuesday night and was diagnosed with an ovarian cyst. Patient called for an appt at the clinic but cannot be seen until March. Was instructed that if she felt any worse, to come to MAU.

## 2013-08-03 NOTE — MAU Note (Signed)
Patient states she had a "partial" hysterectomy and takes BP meds.

## 2013-08-04 LAB — URINE CULTURE
COLONY COUNT: NO GROWTH
CULTURE: NO GROWTH

## 2013-08-14 ENCOUNTER — Ambulatory Visit: Payer: Self-pay

## 2013-08-29 ENCOUNTER — Encounter: Payer: Self-pay | Admitting: Obstetrics & Gynecology

## 2013-08-29 ENCOUNTER — Ambulatory Visit (INDEPENDENT_AMBULATORY_CARE_PROVIDER_SITE_OTHER): Payer: Self-pay | Admitting: Obstetrics & Gynecology

## 2013-08-29 VITALS — BP 140/93 | HR 74 | Temp 97.5°F | Ht 73.0 in | Wt 227.6 lb

## 2013-08-29 DIAGNOSIS — N83209 Unspecified ovarian cyst, unspecified side: Secondary | ICD-10-CM

## 2013-08-29 MED ORDER — OXYCODONE HCL 5 MG PO TABS: 5.0000 mg | ORAL_TABLET | ORAL | Status: DC | PRN

## 2013-08-29 NOTE — Progress Notes (Signed)
   CLINIC ENCOUNTER NOTE  History:  43 y.o. [id number] here today for follow up of hemorrhagic ovarian cyst. Still reports significant constant pain. She is allergic to several medications, has been taking Percocet and Acetaminophen. No N/V or other symptoms.  The following portions of the patient's history were reviewed and updated as appropriate: allergies, current medications, past family history, past medical history, past social history, past surgical history and problem list.  Review of Systems:  Pertinent items are noted in HPI.  Objective:  Physical Exam BP 140/93  Pulse 74  Temp(Src) 97.5 F (36.4 C) (Oral)  Ht 6\' 1"  (1.854 m)  Wt 227 lb 9.6 oz (103.239 kg)  BMI 30.03 kg/m2 Gen: NAD Abd: Soft, mild diffuse tenderness in lower abdomen Pelvic: Deferred  Labs and Imaging 08/03/2013 TRANSABDOMINAL AND TRANSVAGINAL ULTRASOUND OF PELVIS    CLINICAL DATA:  Ovarian cyst:  TECHNIQUE: Both transabdominal and transvaginal ultrasound examinations of the pelvis were performed. Transabdominal technique was performed for global imaging of the pelvis including uterus, ovaries, adnexal regions, and pelvic cul-de-sac. It was necessary to proceed with endovaginal exam following the transabdominal exam to visualize the ovaries.  COMPARISON:  CT ABD/PELV WO CM dated 07/30/2013  FINDINGS: Uterus  Measurements: Surgically absent. No central pelvic mass  Endometrium  Thickness: Surgically absent.  Right ovary  Measurements: 7.4 x 4.6 x 5.3 cm. Complex hypoechoic mass within the right ovary measuring 6.3 x 4.5 x 3.5 cm. This appears to be mixed cystic and solid although the solid components could potentially reflect blood products/clot. This could reflect a hemorrhagic cyst, but warrants followup.  Left ovary  Measurements: 3.7 x 1.8 x 2.7 cm. Normal appearance/no adnexal mass.  Other findings  Small amount of free fluid in the pelvis  IMPRESSION: Complex hypoechoic 6.3 cm mass in the right ovary. This may reflect  a hemorrhagic cyst, but warrants followup. Recommend repeat pelvic ultrasound in 1-2 cycles to ensure improvement/resolution.   Electronically Signed   By: Charlett NoseKevin  Dover M.D.   On: 08/03/2013 14:19    Assessment & Plan:  Repeat ultrasound ordered; will follow up results and manage accordingly. Patient given Oxycodone IR; warned about dangers of liver damage with excessive acetaminophen use Torsion precautions discussed. Follow up with PCP regarding elevated BP;  has extensive heart disease history   Jaynie CollinsUGONNA  Alireza Pollack, MD, FACOG Attending Obstetrician & Gynecologist Faculty Practice, [facility]

## 2013-08-29 NOTE — Patient Instructions (Signed)

## 2013-08-30 LAB — CA 125: CA 125: 27.1 U/mL (ref 0.0–30.2)

## 2013-08-31 ENCOUNTER — Inpatient Hospital Stay (HOSPITAL_COMMUNITY)
Admission: AD | Admit: 2013-08-31 | Discharge: 2013-08-31 | Disposition: A | Discharge status: Home / Self Care | Payer: Self-pay | Source: Ambulatory Visit | Attending: Obstetrics & Gynecology | Admitting: Obstetrics & Gynecology

## 2013-08-31 ENCOUNTER — Inpatient Hospital Stay (HOSPITAL_COMMUNITY): Payer: Self-pay

## 2013-08-31 DIAGNOSIS — N83209 Unspecified ovarian cyst, unspecified side: Secondary | ICD-10-CM | POA: Insufficient documentation

## 2013-08-31 DIAGNOSIS — F17200 Nicotine dependence, unspecified, uncomplicated: Secondary | ICD-10-CM | POA: Insufficient documentation

## 2013-08-31 DIAGNOSIS — I10 Essential (primary) hypertension: Secondary | ICD-10-CM | POA: Insufficient documentation

## 2013-08-31 LAB — URINALYSIS, ROUTINE W REFLEX MICROSCOPIC
BILIRUBIN URINE: NEGATIVE
Glucose, UA: NEGATIVE mg/dL
Ketones, ur: NEGATIVE mg/dL
Leukocytes, UA: NEGATIVE
Nitrite: NEGATIVE
PH: 6 (ref 5.0–8.0)
Protein, ur: NEGATIVE mg/dL
Specific Gravity, Urine: 1.025 (ref 1.005–1.030)
Urobilinogen, UA: 0.2 mg/dL (ref 0.0–1.0)

## 2013-08-31 LAB — URINE MICROSCOPIC-ADD ON

## 2013-08-31 MED ORDER — ONDANSETRON 8 MG PO TBDP
8.0000 mg | ORAL_TABLET | Freq: Three times a day (TID) | ORAL | Status: DC | PRN
Start: 2013-08-31 — End: 2013-09-14

## 2013-08-31 MED ORDER — OXYCODONE-ACETAMINOPHEN 5-325 MG PO TABS: 2.0000 | ORAL_TABLET | ORAL | Status: DC | PRN

## 2013-08-31 MED ORDER — OXYCODONE-ACETAMINOPHEN 5-325 MG PO TABS
2.0000 | ORAL_TABLET | Freq: Once | ORAL | Status: AC
  Administered 2013-08-31: 2 via ORAL
  Filled 2013-08-31: qty 2

## 2013-08-31 MED ORDER — PROMETHAZINE HCL 25 MG PO TABS: 25.0000 mg | ORAL_TABLET | Freq: Four times a day (QID) | ORAL | Status: DC | PRN

## 2013-08-31 NOTE — MAU Note (Signed)
Pt states has cyst on r ovary. Was seen in Clinic downstairs and was to have u/s and discuss possible surgery. Taking 2 percocet with no relief. Rates pain 10/10.

## 2013-08-31 NOTE — MAU Note (Signed)
43 yo, hx significant partial hysterectomy, R ovarian cyst. She was seen at clinic on Thursday; pain persist, unrelieved by meds. Denies fever, chills, n/v, bloating. Bowel habits normal.

## 2013-08-31 NOTE — MAU Provider Note (Signed)
History     CSN: [id number]  Arrival date and time: 08/31/13 1026   First Provider Initiated Contact with Patient 08/31/13 1117      Chief Complaint  Patient presents with  . Ovarian Cyst   HPI RN note: 43 yo, hx significant partial hysterectomy, R ovarian cyst. She was seen at clinic on Thursday; pain persist, unrelieved by meds. Denies fever, chills, n/v, bloating. Bowel habits normal  Pt took 2 Oxycodone this morning without relief of pain; pt has not been sleeping since Tuesday due to pain. Pt is scheduled for another ultrasound ~next week and to follow up in clinic to discuss surgery options. Pt states she had Percocet when she was here before which relieved her pain better than oxycodone.   Past Medical History  Diagnosis Date  . MI (myocardial infarction)   . Heart murmur   . Asthma   . Hypertension     Past Surgical History  Procedure Laterality Date  . Appendectomy    . Cesarean section    . Abdominal hysterectomy      Family History  Problem Relation Age of Onset  . Cancer Mother   . Hypertension Mother   . Heart disease Father   . Stroke Father   . Arthritis Daughter   . Seizures Son   . Hypertension Maternal Grandmother   . Arthritis Daughter     History  Substance Use Topics  . Smoking status: Current Every Day Smoker -- 0.50 packs/day    Types: Cigarettes  . Smokeless tobacco: Never Used  . Alcohol Use: No    Allergies:  Allergies  Allergen Reactions  . Asa [Aspirin] Hives and Swelling  . Ultram [Tramadol] Nausea And Vomiting    Prescriptions prior to admission  Medication Sig Dispense Refill  . acetaminophen (TYLENOL) 500 MG tablet Take 500 mg by mouth every 6 (six) hours as needed.      Marland Kitchen. albuterol (PROVENTIL HFA;VENTOLIN HFA) 108 (90 BASE) MCG/ACT inhaler Inhale 2 puffs into the lungs every 6 (six) hours as needed for wheezing or shortness of breath.      . cloNIDine (CATAPRES) 0.2 MG tablet Take 0.2 mg by mouth 2 (two) times daily.       Marland Kitchen. oxyCODONE (OXY IR/ROXICODONE) 5 MG immediate release tablet Take 1 tablet (5 mg total) by mouth every 4 (four) hours as needed for severe pain.  30 tablet  0  . oxyCODONE-acetaminophen (PERCOCET/ROXICET) 5-325 MG per tablet Take 1-2 tablets by mouth every 6 (six) hours as needed for severe pain.  15 tablet  0  . oxyCODONE-acetaminophen (PERCOCET/ROXICET) 5-325 MG per tablet Take 2 tablets by mouth every 4 (four) hours as needed for severe pain.  15 tablet  0    Review of Systems  Constitutional: Negative for fever and chills.  Gastrointestinal: Positive for nausea and abdominal pain. Negative for vomiting, diarrhea and constipation.  Genitourinary: Negative for dysuria and urgency.   Physical Exam   Blood pressure 140/90, pulse 73, temperature 98.1 F (36.7 C), temperature source Oral, resp. rate 14, height 6\' 1"  (1.854 m), weight 101.662 kg (224 lb 2 oz).  Physical Exam  Nursing note and vitals reviewed. Constitutional: She is oriented to person, place, and time. She appears well-developed and well-nourished. She appears distressed.  HENT:  Head: Normocephalic.  Eyes: Pupils are equal, round, and reactive to light.  Neck: Normal range of motion.  Cardiovascular: Normal rate.   Respiratory: Effort normal.  GI: Soft. There is tenderness. There is  no rebound.  Musculoskeletal: Normal range of motion.  Neurological: She is alert and oriented to person, place, and time.  Skin: Skin is warm and dry.  Psychiatric: She has a normal mood and affect.    MAU Course  Procedures Results for orders placed during the hospital encounter of 08/31/13 (from the past 24 hour(s))  URINALYSIS, ROUTINE W REFLEX MICROSCOPIC     Status: Abnormal   Collection Time    08/31/13 10:45 AM      Result Value Ref Range   Color, Urine YELLOW  YELLOW   APPearance CLEAR  CLEAR   Specific Gravity, Urine 1.025  1.005 - 1.030   pH 6.0  5.0 - 8.0   Glucose, UA NEGATIVE  NEGATIVE mg/dL   Hgb urine  dipstick MODERATE (*) NEGATIVE   Bilirubin Urine NEGATIVE  NEGATIVE   Ketones, ur NEGATIVE  NEGATIVE mg/dL   Protein, ur NEGATIVE  NEGATIVE mg/dL   Urobilinogen, UA 0.2  0.0 - 1.0 mg/dL   Nitrite NEGATIVE  NEGATIVE   Leukocytes, UA NEGATIVE  NEGATIVE  URINE MICROSCOPIC-ADD ON     Status: Abnormal   Collection Time    08/31/13 10:45 AM      Result Value Ref Range   Squamous Epithelial / LPF FEW (*) RARE   WBC, UA 0-2  <3 WBC/hpf   RBC / HPF 0-2  <3 RBC/hpf   Bacteria, UA FEW (*) RARE  US Transvaginal Non-ob  08/31/2013   CLINICAL DATA:  Will reassess known right ovarian cystic mass.  EXAM: TRANSVAGINAL ULTRASOUND OF PELVIS  TECHNIQUE: Transvaginal ultrasound examination of the pelvis was performed including evaluation of the uterus, ovaries, adnexal regions, and pelvic cul-de-sac.  COMPARISON:  US PELVIS COMPLETE dated 08/03/2013  FINDINGS: Uterus  The uterus is surgically absent.  Right ovary  Measurements: 4.9 x 2.3 x 3.3 cm. There are 2 complex appearing cystic structures associated with the right ovary. One measures 2.7 x 1.8 x 2.5 cm. The second measures 2.5 x 2 x 2.2 cm. The fallopian tube on the right may be dilated and fluid-filled.  Left ovary  Measurements: 2.8 x 2.2 x 1.5 cm. There is a septated cystic structure measuring 10.7 x 5.0 5.9 cm.  Other findings:  No free fluid  IMPRESSION: 1. Since the previous study the dominant cystic structure associated with the right ovary has transitioned to 2 smaller separate appearing complex structures which likely reflect hemorrhagic cysts. The dimensions are as given above. The right fallopian tube is mildly dilated and fluid-filled. 2. On the left there has been interval development of a septated cystic structure without significant internal debris measuring as much as 10.7 cm in diameter.   Electronically Signed   By: David  [place]   On: 08/31/2013 13:10  Dr. Debroah Loop reviewed films and this appears to be error in report- that left ovary is normal and  right ovary has the dominant cystic structure Dr. Debroah Loop here to discuss findings- will have pt f/u next week in clinic with surgeon Assessment and Plan  Right ovarian cyst F/u in clinic next week Percocet for pain  Jill Wright 08/31/2013, 11:18 AM

## 2013-09-02 ENCOUNTER — Encounter: Payer: Self-pay | Admitting: Obstetrics & Gynecology

## 2013-09-04 ENCOUNTER — Ambulatory Visit: Payer: Self-pay | Admitting: Obstetrics and Gynecology

## 2013-09-05 ENCOUNTER — Ambulatory Visit (HOSPITAL_COMMUNITY): Admission: RE | Admit: 2013-09-05 | Payer: Self-pay | Source: Ambulatory Visit

## 2013-09-09 ENCOUNTER — Ambulatory Visit (INDEPENDENT_AMBULATORY_CARE_PROVIDER_SITE_OTHER): Payer: Self-pay | Admitting: Family Medicine

## 2013-09-09 ENCOUNTER — Encounter: Payer: Self-pay | Admitting: Family Medicine

## 2013-09-09 VITALS — BP 148/98 | HR 78 | Temp 97.9°F | Wt 224.9 lb

## 2013-09-09 DIAGNOSIS — N83209 Unspecified ovarian cyst, unspecified side: Secondary | ICD-10-CM

## 2013-09-09 DIAGNOSIS — Z720 Tobacco use: Secondary | ICD-10-CM | POA: Insufficient documentation

## 2013-09-09 DIAGNOSIS — F17200 Nicotine dependence, unspecified, uncomplicated: Secondary | ICD-10-CM

## 2013-09-09 DIAGNOSIS — I10 Essential (primary) hypertension: Secondary | ICD-10-CM

## 2013-09-09 DIAGNOSIS — J45909 Unspecified asthma, uncomplicated: Secondary | ICD-10-CM

## 2013-09-09 DIAGNOSIS — J4520 Mild intermittent asthma, uncomplicated: Secondary | ICD-10-CM | POA: Insufficient documentation

## 2013-09-09 DIAGNOSIS — R011 Cardiac murmur, unspecified: Secondary | ICD-10-CM

## 2013-09-09 MED ORDER — OXYCODONE-ACETAMINOPHEN 5-325 MG PO TABS: 1.0000 | ORAL_TABLET | Freq: Four times a day (QID) | ORAL | Status: DC | PRN

## 2013-09-09 NOTE — Patient Instructions (Signed)
Bilateral Salpingo-Oophorectomy Bilateral salpingo-oophorectomy is the surgical removal of both fallopian tubes and both ovaries. The ovaries are small organs that produce eggs in women. The fallopian tubes transport the egg from the ovary to the womb (uterus). Usually, when this surgery is done, the uterus was previously removed. A bilateral salpingo-oophorectomy may be done to treat cancer or to reduce the risk of cancer in women who are at high risk. Removing both fallopian tubes and both ovaries will make you unable to become pregnant (sterile). It will also put you into menopause so that you will no longer have menstrual periods and may have menopausal symptoms such as hot flashes, night sweats, and mood changes. It will not affect your sex drive. LET YOUR HEALTH CARE PROVIDER KNOW ABOUT:  Any allergies you have.  All medicines you are taking, including vitamins, herbs, eye drops, creams, and over-the-counter medicines.  Previous problems you or members of your family have had with the use of anesthetics.  Any blood disorders you have.  Previous surgeries you have had.  Medical conditions you have. RISKS AND COMPLICATIONS Generally, this is a safe procedure. However, as with any procedure, complications can occur. Possible complications include:  Injury to surrounding organs.  Bleeding.  Infection.  Blood clots in the legs or lungs.  Problems related to anesthesia. BEFORE THE PROCEDURE  Ask your health care provider about changing or stopping your regular medicines. You may need to stop taking certain medicines, such as aspirin or blood thinners, at least 1 week before the surgery.  Do not eat or drink anything for at least 8 hours before the surgery.  If you smoke, do not smoke for at least 2 weeks before the surgery.  Make plans to have someone drive you home after the procedure or after your hospital stay. Also arrange for someone to help you with activities during  recovery. PROCEDURE   You will be given medicine to help you relax before the procedure (sedative). You will then be given medicine to make you sleep through the procedure (general anesthetic). These medicines will be given through an IV access tube that is put into one of your veins.  Once you are asleep, your lower abdomen will be shaved and cleaned. A thin, flexible tube (catheter) will be placed in your bladder.  The surgeon may use a laparoscopic, robotic, or open technique for this surgery:  In the laparoscopic technique, the surgery is done through two small cuts (incisions) in the abdomen. A thin, lighted tube with a tiny camera on the end (laparoscope) is inserted into one of the incisions. The tools needed for the procedure are put through the other incision.  A robotic technique may be chosen to perform complex surgery in a small space. In the robotic technique, small incisions will be made. A camera and surgical instruments are passed through the incisions. Surgical instruments will be controlled with the help of a robotic arm.  In the open technique, the surgery is done through one large incision in the abdomen.  Using any of these techniques, the surgeon removes the fallopian tubes and ovaries. The blood vessels will be clamped and tied.  The surgeon then uses staples or stitches to close the incision or incisions. AFTER THE PROCEDURE  You will be taken to a recovery area where you will be monitored for 1 to 3 hours. Your blood pressure, pulse, and temperature will be checked often. You will remain in the recovery area until you are stable and waking   up.  If the laparoscopic technique was used, you may be allowed to go home after several hours. You may have some shoulder pain after the laparoscopic procedure. This is normal and usually goes away in a day or two.  If the open technique was used, you will be admitted to the hospital for a couple of days.  You will be given pain  medicine as needed.  The IV access tube and catheter will be removed before you are discharged. Document Released: 06/13/2005 Document Revised: 02/13/2013 Document Reviewed: 12/05/2012 [employer] Patient Information 2014 [employer].  

## 2013-09-09 NOTE — Assessment & Plan Note (Addendum)
Vs. Bilateral hydrosalpinges. We should probably re-scan pt. Prior to surgery.  She seems adamant about surgery and for laparotomy.  Would still start with laparoscopy. Discussed risks/benefits alternatives to surgery.  Risks include but are not limited to bleeding, infection, injury to surrounding structures, including bowel, bladder and ureters, blood clots, and death.  Likelihood of success is high. Advised that we recommend laparosciopic approach if possible.

## 2013-09-09 NOTE — Progress Notes (Addendum)
    Subjective:    Patient ID: Jill Wright is a 43 y.o. female presenting with Surgery consult  on 09/09/2013  HPI: H/o of partial hysterectomy.  Seen in ED twice for pain and ovarian cyst noted. Had ovarian cyst on right--which seems to resolved however, new cyst on left-may be error in report. Pain continue to be on the right.  She desires definitive treatment.  Pt. Would not want any sort of laparoscopic surgery. She desires open laparotomy with hospital stay and BSO.  Oxycodone does not work--she would like Percocet.  Review of Systems  Constitutional: Negative for fever and chills.  Respiratory: Negative for wheezing.   Gastrointestinal: Negative for nausea and vomiting.  Genitourinary: Negative for vaginal bleeding.      Objective:    BP 148/98  Pulse 78  Temp(Src) 97.9 F (36.6 C) (Oral)  Wt 224 lb 14.4 oz (102.014 kg) Physical Exam  Vitals reviewed. Constitutional: She is oriented to person, place, and time. She appears well-developed and well-nourished.  Eyes: No scleral icterus.  Neck: Neck supple.  Cardiovascular: Normal rate.   No murmur heard. Pulmonary/Chest: Effort normal.  Abdominal: Soft. There is tenderness (mild).  Musculoskeletal: Normal range of motion.  Neurological: She is alert and oriented to person, place, and time.  Skin: Skin is warm.  Psychiatric: She has a normal mood and affect.        Assessment & Plan:   Ovarian cyst Vs. Bilateral hydrosalpinges. We should probably re-scan pt. Prior to surgery.  She seems adamant about surgery and for laparotomy.  Would still start with laparoscopy. Discussed risks/benefits alternatives to surgery.  Risks include but are not limited to bleeding, infection, injury to surrounding structures, including bowel, bladder and ureters, blood clots, and death.  Likelihood of success is high. Advised that we recommend laparosciopic approach if possible.    Pt. Reports, she may go home to have surgery  sooner. Return in about 3 months (around 12/10/2013) for postop check.

## 2013-09-14 ENCOUNTER — Encounter (HOSPITAL_COMMUNITY): Payer: Self-pay | Admitting: Emergency Medicine

## 2013-09-14 ENCOUNTER — Inpatient Hospital Stay (HOSPITAL_COMMUNITY)
Admission: EM | Admit: 2013-09-14 | Discharge: 2013-09-18 | DRG: 759 | Disposition: A | Discharge status: Home / Self Care | Payer: Self-pay | Attending: Obstetrics & Gynecology | Admitting: Obstetrics & Gynecology

## 2013-09-14 DIAGNOSIS — R1900 Intra-abdominal and pelvic swelling, mass and lump, unspecified site: Secondary | ICD-10-CM

## 2013-09-14 DIAGNOSIS — Z90710 Acquired absence of both cervix and uterus: Secondary | ICD-10-CM

## 2013-09-14 DIAGNOSIS — N7013 Chronic salpingitis and oophoritis: Principal | ICD-10-CM | POA: Diagnosis present

## 2013-09-14 DIAGNOSIS — I10 Essential (primary) hypertension: Secondary | ICD-10-CM | POA: Diagnosis present

## 2013-09-14 DIAGNOSIS — N7011 Chronic salpingitis: Secondary | ICD-10-CM | POA: Diagnosis present

## 2013-09-14 DIAGNOSIS — N7093 Salpingitis and oophoritis, unspecified: Secondary | ICD-10-CM

## 2013-09-14 DIAGNOSIS — N949 Unspecified condition associated with female genital organs and menstrual cycle: Secondary | ICD-10-CM | POA: Diagnosis present

## 2013-09-14 DIAGNOSIS — J45909 Unspecified asthma, uncomplicated: Secondary | ICD-10-CM | POA: Diagnosis present

## 2013-09-14 DIAGNOSIS — R011 Cardiac murmur, unspecified: Secondary | ICD-10-CM | POA: Diagnosis present

## 2013-09-14 DIAGNOSIS — R319 Hematuria, unspecified: Secondary | ICD-10-CM | POA: Diagnosis present

## 2013-09-14 DIAGNOSIS — F17200 Nicotine dependence, unspecified, uncomplicated: Secondary | ICD-10-CM | POA: Diagnosis present

## 2013-09-14 DIAGNOSIS — A599 Trichomoniasis, unspecified: Secondary | ICD-10-CM

## 2013-09-14 HISTORY — DX: Anxiety disorder, unspecified: F41.9

## 2013-09-14 LAB — URINALYSIS, ROUTINE W REFLEX MICROSCOPIC
BILIRUBIN URINE: NEGATIVE
Glucose, UA: NEGATIVE mg/dL
Ketones, ur: NEGATIVE mg/dL
Nitrite: NEGATIVE
Protein, ur: 100 mg/dL — AB
SPECIFIC GRAVITY, URINE: 1.026 (ref 1.005–1.030)
Urobilinogen, UA: 1 mg/dL (ref 0.0–1.0)
pH: 5.5 (ref 5.0–8.0)

## 2013-09-14 LAB — URINE MICROSCOPIC-ADD ON

## 2013-09-14 LAB — POC URINE PREG, ED: Preg Test, Ur: NEGATIVE

## 2013-09-14 MED ORDER — ONDANSETRON HCL 4 MG/2ML IJ SOLN
4.0000 mg | Freq: Once | INTRAMUSCULAR | Status: AC
  Administered 2013-09-15: 4 mg via INTRAVENOUS
  Filled 2013-09-14: qty 2

## 2013-09-14 MED ORDER — HYDROMORPHONE HCL PF 1 MG/ML IJ SOLN
0.5000 mg | Freq: Once | INTRAMUSCULAR | Status: AC
  Administered 2013-09-15: 0.5 mg via INTRAVENOUS
  Filled 2013-09-14: qty 1

## 2013-09-14 MED ORDER — SODIUM CHLORIDE 0.9 % IV BOLUS (SEPSIS)
1000.0000 mL | Freq: Once | INTRAVENOUS | Status: AC
  Administered 2013-09-15: 1000 mL via INTRAVENOUS

## 2013-09-14 NOTE — ED Notes (Signed)
Pt brought in by EMS for c/o lower abd pain on both sides and states she has some blood in her urine  Pt was recently diagnosed with ovarian cyst  Pt states she ran out of her pain medication, percocet 5/325, today  Pt rating her pain as a 10/10 on pain scale

## 2013-09-14 NOTE — ED Provider Notes (Signed)
CSN: [id number]     Arrival date & time 09/14/13  2139 History   First MD Initiated Contact with Patient 09/14/13 2235     Chief Complaint  Patient presents with  . Abdominal Pain  . Hematuria     (Consider location/radiation/quality/duration/timing/severity/associated sxs/prior Treatment) The history is provided by the patient. No language interpreter was used.  Jill Wright is a 43 y/o F with PMhx of asthma, ovarian cysts, HTN presenting to the ED with pelvis pain that has been ongoing for the past 2 months with worsening over the past couple of days. Patient reported that the pain is localized to the right and left pelvic regions with most discomfort to the right pelvic region. Patient described the pain as a "sticking" pain, as if someone is taking a knife and sticking her in the abdomen. Patient reported that the left pelvic region pain radiates to the left lower back. Reported that she has been taking Percocets with minimal relief - stated that she was seen and assessed by an OB/GYN physician who prescribed her Percocets, reported that when she filled these Percocet she was unable to take it for one day, reported that she left her house and medications were gone - reported that she has been using her fiances medication. Stated that she has noticed hematuria over the past couple of days. Denied menstrual cycle since she has had a hysterectomy more than 5 years ago. Denied nausea, vomiting, diarrhea, melena, hematochezia, vaginal discharge, vaginal pain, abnormal vaginal bleeding, numbness, tingling, dysuria, chest pain, shortness of breath, difficulty breathing, weakness, headache, dizziness. PCP none  Past Medical History  Diagnosis Date  . Heart murmur   . Asthma   . Hypertension   . Anxiety    Past Surgical History  Procedure Laterality Date  . Appendectomy    . Cesarean section    . Abdominal hysterectomy     Family History  Problem Relation Age of Onset  . Cancer Mother  6548    breast  . Hypertension Mother   . Heart disease Father   . Stroke Father   . Arthritis Daughter   . Seizures Son   . Hypertension Maternal Grandmother   . Arthritis Daughter    History  Substance Use Topics  . Smoking status: Current Every Day Smoker -- 0.50 packs/day    Types: Cigarettes  . Smokeless tobacco: Never Used  . Alcohol Use: No   OB History   Grav Para Term Preterm Abortions TAB SAB Ect Mult Living   3 3 3       2      Review of Systems  Constitutional: Negative for fever and chills.  Eyes: Negative for visual disturbance.  Respiratory: Negative for chest tightness and shortness of breath.   Gastrointestinal: Negative for nausea, vomiting, abdominal pain, diarrhea, constipation and blood in stool.  Genitourinary: Positive for hematuria and pelvic pain (Bilateral pelvic pain). Negative for dysuria, flank pain, vaginal bleeding, vaginal discharge and vaginal pain.  Neurological: Negative for dizziness and weakness.  All other systems reviewed and are negative.      Allergies  Asa and Ultram  Home Medications   Current Outpatient Rx  Name  Route  Sig  Dispense  Refill  . albuterol (PROVENTIL HFA;VENTOLIN HFA) 108 (90 BASE) MCG/ACT inhaler   Inhalation   Inhale 2 puffs into the lungs every 6 (six) hours as needed for wheezing or shortness of breath.         . cloNIDine (CATAPRES) 0.2  MG tablet   Oral   Take 0.2 mg by mouth 2 (two) times daily.          BP 149/89  Pulse 72  Temp(Src) 98 F (36.7 C) (Oral)  Resp 18  SpO2 98% Physical Exam  Nursing note and vitals reviewed. Constitutional: She is oriented to person, place, and time. She appears well-developed and well-nourished. No distress.  HENT:  Head: Normocephalic and atraumatic.  Mouth/Throat: Oropharynx is clear and moist. No oropharyngeal exudate.  Eyes: Conjunctivae and EOM are normal. Pupils are equal, round, and reactive to light. Right eye exhibits no discharge. Left eye  exhibits no discharge.  Neck: Normal range of motion. Neck supple. No tracheal deviation present.  Cardiovascular: Normal rate, regular rhythm and normal heart sounds.  Exam reveals no friction rub.   No murmur heard. Pulses:      Radial pulses are 2+ on the right side, and 2+ on the left side.  Pulmonary/Chest: Effort normal and breath sounds normal. No respiratory distress. She has no wheezes. She has no rales.  Abdominal: Soft. Normal appearance and bowel sounds are normal. There is tenderness in the right lower quadrant, suprapubic area and left lower quadrant. There is no guarding.    Discomfort upon palpation to the right and left pelvic regions-mild discomfort upon palpation to suprapubic Negative abdominal distention Negative Murphy's sign Negative McBurney's point  Genitourinary:  Pelvic Exam: Negative swelling, erythema, inflammation, lesions, sores noted to the external genitalia. Negative swelling, erythema, inflammation, lesions, sores, masses noted to the vaginal canal. Cervix identify what negative inflammation or strawberry appearance. Thick white discharge identified with negative odor. Negative blood in the vaginal vault. Positive CMT and bilateral adnexal tenderness-most discomfort upon palpation to the right adnexal region. Exam chaperoned with tech.  Musculoskeletal: Normal range of motion.  Full ROM to upper and lower extremities without difficulty noted, negative ataxia noted.  Lymphadenopathy:    She has no cervical adenopathy.  Neurological: She is alert and oriented to person, place, and time. No cranial nerve deficit. She exhibits normal muscle tone. Coordination normal.  Skin: Skin is warm and dry. No rash noted. She is not diaphoretic. No erythema.  Psychiatric: She has a normal mood and affect. Her behavior is normal. Thought content normal.    ED Course  Procedures (including critical care time)  2:18 AM This provider spoke with Dr. Marice Potter, OBGYN, regarding  case, history, presentation, labs, and imaging. Discussed that patient can be discharged with Percocets and for patient to call Dr. Shawnie Pons first thing in the morning and to go to the clinic on Monday to see Dr. Shawnie Pons. Physician reported that patient does not need to be on antibiotics.   Patient denied allergies to antibiotics - reported only allergy is to ASA and Ultram.   2:45 AM This provider spoke with Dr. Marice Potter, at [facility] - Dr. Marice Potter is the on-call physician with Dr. Gildardo Griffes who is patient's OBGYN. Discussed concern regarding patient and increased change on [place] from 08/31/2013 - Dr. Marice Potter recommended patient to be transferred to [facility] to be monitored.   Results for orders placed during the hospital encounter of 09/14/13  WET PREP, GENITAL      Result Value Ref Range   Yeast Wet Prep HPF POC NONE SEEN  NONE SEEN   Trich, Wet Prep MANY (*) NONE SEEN   Clue Cells Wet Prep HPF POC MANY (*) NONE SEEN   WBC, Wet Prep HPF POC FEW (*) NONE SEEN  URINALYSIS,  ROUTINE W REFLEX MICROSCOPIC      Result Value Ref Range   Color, Urine YELLOW  YELLOW   APPearance CLOUDY (*) CLEAR   Specific Gravity, Urine 1.026  1.005 - 1.030   pH 5.5  5.0 - 8.0   Glucose, UA NEGATIVE  NEGATIVE mg/dL   Hgb urine dipstick SMALL (*) NEGATIVE   Bilirubin Urine NEGATIVE  NEGATIVE   Ketones, ur NEGATIVE  NEGATIVE mg/dL   Protein, ur 161 (*) NEGATIVE mg/dL   Urobilinogen, UA 1.0  0.0 - 1.0 mg/dL   Nitrite NEGATIVE  NEGATIVE   Leukocytes, UA SMALL (*) NEGATIVE  CBC WITH DIFFERENTIAL      Result Value Ref Range   WBC 11.6 (*) 4.0 - 10.5 K/uL   RBC 4.36  3.87 - 5.11 MIL/uL   Hemoglobin 12.9  12.0 - 15.0 g/dL   HCT 09.6  04.5 - 40.9 %   MCV 89.7  78.0 - 100.0 fL   MCH 29.6  26.0 - 34.0 pg   MCHC 33.0  30.0 - 36.0 g/dL   RDW 81.1  91.4 - 78.2 %   Platelets 198  150 - 400 K/uL   Neutrophils Relative % 52  43 - 77 %   Neutro Abs 6.0  1.7 - 7.7 K/uL   Lymphocytes Relative 39  12 - 46 %   Lymphs Abs 4.5 (*) 0.7  - 4.0 K/uL   Monocytes Relative 5  3 - 12 %   Monocytes Absolute 0.6  0.1 - 1.0 K/uL   Eosinophils Relative 3  0 - 5 %   Eosinophils Absolute 0.4  0.0 - 0.7 K/uL   Basophils Relative 1  0 - 1 %   Basophils Absolute 0.1  0.0 - 0.1 K/uL  COMPREHENSIVE METABOLIC PANEL      Result Value Ref Range   Sodium 141  137 - 147 mEq/L   Potassium 3.7  3.7 - 5.3 mEq/L   Chloride 102  96 - 112 mEq/L   CO2 26  19 - 32 mEq/L   Glucose, Bld 101 (*) 70 - 99 mg/dL   BUN 7  6 - 23 mg/dL   Creatinine, Ser 9.56 (*) 0.50 - 1.10 mg/dL   Calcium 9.4  8.4 - 21.3 mg/dL   Total Protein 6.7  6.0 - 8.3 g/dL   Albumin 3.4 (*) 3.5 - 5.2 g/dL   AST 12  0 - 37 U/L   ALT 8  0 - 35 U/L   Alkaline Phosphatase 67  39 - 117 U/L   Total Bilirubin <0.2 (*) 0.3 - 1.2 mg/dL   GFR calc non Af Amer >90  >90 mL/min   GFR calc Af Amer >90  >90 mL/min  LIPASE, BLOOD      Result Value Ref Range   Lipase 23  11 - 59 U/L  URINE MICROSCOPIC-ADD ON      Result Value Ref Range   Squamous Epithelial / LPF MANY (*) RARE   WBC, UA 7-10  <3 WBC/hpf   RBC / HPF 3-6  <3 RBC/hpf   Bacteria, UA FEW (*) RARE   Urine-Other MUCOUS PRESENT    POC URINE PREG, ED      Result Value Ref Range   Preg Test, Ur NEGATIVE  NEGATIVE    Labs Review Labs Reviewed  WET PREP, GENITAL - Abnormal; Notable for the following:    Trich, Wet Prep MANY (*)    Clue Cells Wet Prep HPF POC MANY (*)  WBC, Wet Prep HPF POC FEW (*)    All other components within normal limits  URINALYSIS, ROUTINE W REFLEX MICROSCOPIC - Abnormal; Notable for the following:    APPearance CLOUDY (*)    Hgb urine dipstick SMALL (*)    Protein, ur 100 (*)    Leukocytes, UA SMALL (*)    All other components within normal limits  CBC WITH DIFFERENTIAL - Abnormal; Notable for the following:    WBC 11.6 (*)    Lymphs Abs 4.5 (*)    All other components within normal limits  COMPREHENSIVE METABOLIC PANEL - Abnormal; Notable for the following:    Glucose, Bld 101 (*)     Creatinine, Ser 0.47 (*)    Albumin 3.4 (*)    Total Bilirubin <0.2 (*)    All other components within normal limits  URINE MICROSCOPIC-ADD ON - Abnormal; Notable for the following:    Squamous Epithelial / LPF MANY (*)    Bacteria, UA FEW (*)    All other components within normal limits  GC/CHLAMYDIA PROBE AMP  LIPASE, BLOOD  POC URINE PREG, ED   Imaging Review US Transvaginal Non-ob  09/15/2013   CLINICAL DATA:  Abdominal pain. Elevated white blood cell count. Cystic lesions within the pelvis on prior ultrasound and CT.  EXAM: TRANSABDOMINAL AND TRANSVAGINAL ULTRASOUND OF PELVIS  DOPPLER ULTRASOUND OF OVARIES  TECHNIQUE: Both transabdominal and transvaginal ultrasound examinations of the pelvis were performed. Transabdominal technique was performed for global imaging of the pelvis including uterus, ovaries, adnexal regions, and pelvic cul-de-sac.  It was necessary to proceed with endovaginal exam following the transabdominal exam to visualize the ovaries . Color and duplex Doppler ultrasound was utilized to evaluate blood flow to the ovaries.  COMPARISON:  US TRANSVAGINAL NON-OB dated 08/31/2013  FINDINGS: Uterus  Measurements: Surgically absent.  There is a large complex tubular cystic structure within the mid pelvis extending to the right adnexa is increased in a complexity compared to prior with multiple internal septations. This has the appearance of an abscess. This tubular cystic lesions with internal septation measures 9.3 x 4.0 x 4.1 cm and is increased in complexity compared to prior.  There appears to be normal ovarian tissue within the right adnexa with normal arterial and venous spectral waveforms. This normal portion of the ovary is adjacent to the complex septated cystic lesion and measures 3.3 x 2.2 cm.  The left ovary appears normal and is only identified transabdominally. Left ovary measures 2.1 x 5.1 x 2.7 cm  The right fallopian tube is enlarged.  IMPRESSION:  1. Increased in  complexity of tubular cystic structure in the central pelvis and right adnexa with new internal septations is most concerning for tubo-ovarian abscess or hydro pyosalpinx. 2. Relatively normal right ovarian tissue is identified with flow so ovarian torsion is felt less likely. 3. The left ovary appears normal in size and with normal flow Findings conveyed toMARISSA Audrick Lamoureaux on 09/15/2013  at01:45.   Electronically Signed   By: Genevive Bi M.D.   On: 09/15/2013 01:46   US Pelvis Complete  09/15/2013   CLINICAL DATA:  Abdominal pain. Elevated white blood cell count. Cystic lesions within the pelvis on prior ultrasound and CT.  EXAM: TRANSABDOMINAL AND TRANSVAGINAL ULTRASOUND OF PELVIS  DOPPLER ULTRASOUND OF OVARIES  TECHNIQUE: Both transabdominal and transvaginal ultrasound examinations of the pelvis were performed. Transabdominal technique was performed for global imaging of the pelvis including uterus, ovaries, adnexal regions, and pelvic cul-de-sac.  It was necessary  to proceed with endovaginal exam following the transabdominal exam to visualize the ovaries . Color and duplex Doppler ultrasound was utilized to evaluate blood flow to the ovaries.  COMPARISON:  US TRANSVAGINAL NON-OB dated 08/31/2013  FINDINGS: Uterus  Measurements: Surgically absent.  There is a large complex tubular cystic structure within the mid pelvis extending to the right adnexa is increased in a complexity compared to prior with multiple internal septations. This has the appearance of an abscess. This tubular cystic lesions with internal septation measures 9.3 x 4.0 x 4.1 cm and is increased in complexity compared to prior.  There appears to be normal ovarian tissue within the right adnexa with normal arterial and venous spectral waveforms. This normal portion of the ovary is adjacent to the complex septated cystic lesion and measures 3.3 x 2.2 cm.  The left ovary appears normal and is only identified transabdominally. Left ovary measures  2.1 x 5.1 x 2.7 cm  The right fallopian tube is enlarged.  IMPRESSION:  1. Increased in complexity of tubular cystic structure in the central pelvis and right adnexa with new internal septations is most concerning for tubo-ovarian abscess or hydro pyosalpinx. 2. Relatively normal right ovarian tissue is identified with flow so ovarian torsion is felt less likely. 3. The left ovary appears normal in size and with normal flow Findings conveyed toMARISSA Shantelle Alles on 09/15/2013  at01:45.   Electronically Signed   By: Genevive Bi M.D.   On: 09/15/2013 01:46   [place] Art/ven Flow Abd Pelv Doppler  09/15/2013   CLINICAL DATA:  Abdominal pain. Elevated white blood cell count. Cystic lesions within the pelvis on prior ultrasound and CT.  EXAM: TRANSABDOMINAL AND TRANSVAGINAL ULTRASOUND OF PELVIS  DOPPLER ULTRASOUND OF OVARIES  TECHNIQUE: Both transabdominal and transvaginal ultrasound examinations of the pelvis were performed. Transabdominal technique was performed for global imaging of the pelvis including uterus, ovaries, adnexal regions, and pelvic cul-de-sac.  It was necessary to proceed with endovaginal exam following the transabdominal exam to visualize the ovaries . Color and duplex Doppler ultrasound was utilized to evaluate blood flow to the ovaries.  COMPARISON:  US TRANSVAGINAL NON-OB dated 08/31/2013  FINDINGS: Uterus  Measurements: Surgically absent.  There is a large complex tubular cystic structure within the mid pelvis extending to the right adnexa is increased in a complexity compared to prior with multiple internal septations. This has the appearance of an abscess. This tubular cystic lesions with internal septation measures 9.3 x 4.0 x 4.1 cm and is increased in complexity compared to prior.  There appears to be normal ovarian tissue within the right adnexa with normal arterial and venous spectral waveforms. This normal portion of the ovary is adjacent to the complex septated cystic lesion and measures 3.3  x 2.2 cm.  The left ovary appears normal and is only identified transabdominally. Left ovary measures 2.1 x 5.1 x 2.7 cm  The right fallopian tube is enlarged.  IMPRESSION:  1. Increased in complexity of tubular cystic structure in the central pelvis and right adnexa with new internal septations is most concerning for tubo-ovarian abscess or hydro pyosalpinx. 2. Relatively normal right ovarian tissue is identified with flow so ovarian torsion is felt less likely. 3. The left ovary appears normal in size and with normal flow Findings conveyed toMARISSA Oneal Schoenberger on 09/15/2013  at01:45.   Electronically Signed   By: Genevive Bi M.D.   On: 09/15/2013 01:46     EKG Interpretation None      MDM  Final diagnoses:  Tubo-ovarian abscess  Trichomoniasis   Medications  metroNIDAZOLE (FLAGYL) tablet 500 mg (not administered)  sodium chloride 0.9 % bolus 1,000 mL (0 mLs Intravenous Stopped 09/15/13 0115)  HYDROmorphone (DILAUDID) injection 0.5 mg (0.5 mg Intravenous Given 09/15/13 0008)  ondansetron (ZOFRAN) injection 4 mg (4 mg Intravenous Given 09/15/13 0008)  sodium chloride 0.9 % bolus 1,000 mL (0 mLs Intravenous Stopped 09/15/13 0326)  HYDROmorphone (DILAUDID) injection 1 mg (1 mg Intravenous Given 09/15/13 0234)  ondansetron (ZOFRAN) injection 4 mg (4 mg Intravenous Given 09/15/13 0234)  cefTRIAXone (ROCEPHIN) injection 250 mg (250 mg Intramuscular Given 09/15/13 0328)  azithromycin (ZITHROMAX) tablet 1,000 mg (1,000 mg Oral Given 09/15/13 0327)  lidocaine (XYLOCAINE) 1 % (with pres) injection (20 mLs  Given 09/15/13 0328)   Filed Vitals:   09/14/13 2143 09/15/13 0207  BP: 158/102 149/89  Pulse: 75 72  Temp: 98.8 F (37.1 C) 98 F (36.7 C)  TempSrc: Oral Oral  Resp: 18 18  SpO2: 97% 98%    Patient presenting to the ED with bilateral pelvic pain does been ongoing for the past 2 months with worsening over the past couple of days. Reported that the pain is localized to bilateral pelvic regions  with the right-sided worse than left. Stated the pain is described as a "sticking" pain as if a knife is stabbing her. Patient reported that she was seen and assessed at [facility] where she was discharged with Percocets - reported that she had the medications filled, but stated that the medications were taken from her house. Patient reported that she is unable to tolerate the pain.  This provider reviewed patient's chart. Patient has a long history of ovarian cysts. Patient was seen and assessed to [facility] hospital by Dr. Tinnie Gens on 09/09/2013 when she was diagnosed with ovarian cysts and discharged with Percocets. Patient has a scheduled bilateral salpingo-oophorectomy for 11/21/2013 to be performed by Dr. Tinnie Gens.  Alert and oriented. GCS 15. Heart rate and rhythm normal. Lungs clear to auscultation to upper and lower lobes bilaterally. Radial pulses 2+ bilaterally. Negative abdominal distention noted. Bowel sounds normal active in all 4 quadrants. Discomfort upon palpation to the lower quadrants bilaterally and suprapubic region. Discomfort upon palpation to bilateral inguinal regions. Negative lymphadenopathy noted. Thick white discharge noted from the vaginal canal. Positive CMT bilateral adnexal tenderness with right-sided adnexal tenderness with most discomfort. CBC noted elevated WBC of 11.6 with negative leukocytosis or left shift. CMP negative findings-liver and kidney functioning properly. Lipase negative elevation. Urine pregnancy negative. Urinalysis noted small hemoglobin with small metal leukocytes-negative nitrites - pyuria noted with WBC count of 7-10. Pelvic ultrasound identified increased in complexity of tubular cystic structure in the central pelvis and right adnexa with new internal septations is most concerning for tubo-ovarian abscess hydro-pyosalpinx. Normal flow identified-doubt ovarian torsion.  This provider spoke with Dr. Marice Potter from [facility] - at first recommended patient  to be discharged. Based on patient's presentation, labs, imaging and changes to [place] results in a short period of time - this provider did not feel comfortable discharging patient. This provider spoke with Dr. Marice Potter again - physician recommended patient to be transferred to [facility]. Patient presenting to the ED with TOA on [place] - elevated WBC of 11.6. Patient stable, afebrile - negative signs of sepsis at this moment. Discussed plan for transfer with patient - patient understood and agreed. Patient stable for transfer.   Raymon Mutton, PA-C 09/15/13 1014

## 2013-09-15 ENCOUNTER — Emergency Department (HOSPITAL_COMMUNITY): Payer: Self-pay

## 2013-09-15 ENCOUNTER — Other Ambulatory Visit (HOSPITAL_COMMUNITY): Payer: Self-pay

## 2013-09-15 DIAGNOSIS — N7093 Salpingitis and oophoritis, unspecified: Secondary | ICD-10-CM | POA: Diagnosis present

## 2013-09-15 LAB — CBC WITH DIFFERENTIAL/PLATELET
Basophils Absolute: 0.1 10*3/uL (ref 0.0–0.1)
Basophils Relative: 1 % (ref 0–1)
EOS PCT: 3 % (ref 0–5)
Eosinophils Absolute: 0.4 10*3/uL (ref 0.0–0.7)
HEMATOCRIT: 39.1 % (ref 36.0–46.0)
Hemoglobin: 12.9 g/dL (ref 12.0–15.0)
LYMPHS ABS: 4.5 10*3/uL — AB (ref 0.7–4.0)
Lymphocytes Relative: 39 % (ref 12–46)
MCH: 29.6 pg (ref 26.0–34.0)
MCHC: 33 g/dL (ref 30.0–36.0)
MCV: 89.7 fL (ref 78.0–100.0)
MONO ABS: 0.6 10*3/uL (ref 0.1–1.0)
Monocytes Relative: 5 % (ref 3–12)
Neutro Abs: 6 10*3/uL (ref 1.7–7.7)
Neutrophils Relative %: 52 % (ref 43–77)
PLATELETS: 198 10*3/uL (ref 150–400)
RBC: 4.36 MIL/uL (ref 3.87–5.11)
RDW: 13.6 % (ref 11.5–15.5)
WBC: 11.6 10*3/uL — ABNORMAL HIGH (ref 4.0–10.5)

## 2013-09-15 LAB — COMPREHENSIVE METABOLIC PANEL
ALT: 8 U/L (ref 0–35)
AST: 12 U/L (ref 0–37)
Albumin: 3.4 g/dL — ABNORMAL LOW (ref 3.5–5.2)
Alkaline Phosphatase: 67 U/L (ref 39–117)
BUN: 7 mg/dL (ref 6–23)
CALCIUM: 9.4 mg/dL (ref 8.4–10.5)
CO2: 26 mEq/L (ref 19–32)
CREATININE: 0.47 mg/dL — AB (ref 0.50–1.10)
Chloride: 102 mEq/L (ref 96–112)
GFR calc Af Amer: >90 mL/min (ref >90)
GFR calc non Af Amer: >90 mL/min (ref >90)
Glucose, Bld: 101 mg/dL — ABNORMAL HIGH (ref 70–99)
Potassium: 3.7 mEq/L (ref 3.7–5.3)
SODIUM: 141 meq/L (ref 137–147)
TOTAL PROTEIN: 6.7 g/dL (ref 6.0–8.3)
Total Bilirubin: <0.2 mg/dL — ABNORMAL LOW (ref 0.3–1.2)

## 2013-09-15 LAB — HIV ANTIBODY (ROUTINE TESTING W REFLEX): HIV: NONREACTIVE

## 2013-09-15 LAB — WET PREP, GENITAL: Yeast Wet Prep HPF POC: NONE SEEN

## 2013-09-15 LAB — LIPASE, BLOOD: LIPASE: 23 U/L (ref 11–59)

## 2013-09-15 MED ORDER — CLONIDINE HCL 0.2 MG PO TABS
0.2000 mg | ORAL_TABLET | Freq: Two times a day (BID) | ORAL | Status: DC
  Administered 2013-09-15 – 2013-09-16 (×3): 0.2 mg via ORAL
  Filled 2013-09-15 (×3): qty 1

## 2013-09-15 MED ORDER — ACETAMINOPHEN 500 MG PO TABS
1000.0000 mg | ORAL_TABLET | Freq: Four times a day (QID) | ORAL | Status: DC | PRN
  Administered 2013-09-18: 1000 mg via ORAL
  Filled 2013-09-15 (×2): qty 2

## 2013-09-15 MED ORDER — METRONIDAZOLE 500 MG PO TABS
500.0000 mg | ORAL_TABLET | Freq: Two times a day (BID) | ORAL | Status: DC
  Administered 2013-09-15 – 2013-09-17 (×6): 500 mg via ORAL
  Filled 2013-09-15 (×7): qty 1

## 2013-09-15 MED ORDER — ONDANSETRON HCL 4 MG/2ML IJ SOLN
4.0000 mg | Freq: Four times a day (QID) | INTRAMUSCULAR | Status: DC | PRN
  Administered 2013-09-15: 4 mg via INTRAVENOUS
  Filled 2013-09-15: qty 2

## 2013-09-15 MED ORDER — PIPERACILLIN-TAZOBACTAM 3.375 G IVPB
3.3750 g | Freq: Three times a day (TID) | INTRAVENOUS | Status: DC
  Administered 2013-09-15 – 2013-09-18 (×10): 3.375 g via INTRAVENOUS
  Filled 2013-09-15 (×11): qty 50

## 2013-09-15 MED ORDER — HYDROMORPHONE HCL PF 1 MG/ML IJ SOLN
1.0000 mg | Freq: Once | INTRAMUSCULAR | Status: AC
  Administered 2013-09-15: 1 mg via INTRAVENOUS
  Filled 2013-09-15: qty 1

## 2013-09-15 MED ORDER — M.V.I. ADULT IV INJ
INJECTION | Freq: Once | INTRAVENOUS | Status: AC
  Administered 2013-09-15: 12:00:00 via INTRAVENOUS
  Filled 2013-09-15: qty 1000

## 2013-09-15 MED ORDER — CEFTRIAXONE SODIUM 250 MG IJ SOLR
250.0000 mg | Freq: Once | INTRAMUSCULAR | Status: AC
  Administered 2013-09-15: 250 mg via INTRAMUSCULAR
  Filled 2013-09-15: qty 250

## 2013-09-15 MED ORDER — SODIUM CHLORIDE 0.9 % IV BOLUS (SEPSIS)
1000.0000 mL | Freq: Once | INTRAVENOUS | Status: AC
  Administered 2013-09-15: 1000 mL via INTRAVENOUS

## 2013-09-15 MED ORDER — LIDOCAINE HCL 1 % IJ SOLN
INTRAMUSCULAR | Status: AC
  Administered 2013-09-15: 20 mL
  Filled 2013-09-15: qty 20

## 2013-09-15 MED ORDER — PRENATAL MULTIVITAMIN CH
1.0000 | ORAL_TABLET | Freq: Every day | ORAL | Status: DC
  Administered 2013-09-16 – 2013-09-17 (×2): 1 via ORAL
  Filled 2013-09-15 (×2): qty 1

## 2013-09-15 MED ORDER — ONDANSETRON HCL 4 MG PO TABS: 4.0000 mg | ORAL_TABLET | Freq: Four times a day (QID) | ORAL | Status: DC | PRN

## 2013-09-15 MED ORDER — HYDROMORPHONE HCL PF 1 MG/ML IJ SOLN
0.2000 mg | INTRAMUSCULAR | Status: DC | PRN
  Administered 2013-09-16: 0.6 mg via INTRAVENOUS
  Administered 2013-09-17: 0.5 mg via INTRAVENOUS
  Filled 2013-09-15 (×2): qty 1

## 2013-09-15 MED ORDER — ONDANSETRON HCL 4 MG/2ML IJ SOLN
4.0000 mg | Freq: Once | INTRAMUSCULAR | Status: AC
  Administered 2013-09-15: 4 mg via INTRAVENOUS
  Filled 2013-09-15: qty 2

## 2013-09-15 MED ORDER — ALUM & MAG HYDROXIDE-SIMETH 200-200-20 MG/5ML PO SUSP: 30.0000 mL | ORAL | Status: DC | PRN

## 2013-09-15 MED ORDER — METRONIDAZOLE 500 MG PO TABS
500.0000 mg | ORAL_TABLET | Freq: Once | ORAL | Status: AC
  Administered 2013-09-15: 500 mg via ORAL
  Filled 2013-09-15: qty 1

## 2013-09-15 MED ORDER — AZITHROMYCIN 250 MG PO TABS
1000.0000 mg | ORAL_TABLET | Freq: Once | ORAL | Status: AC
  Administered 2013-09-15: 1000 mg via ORAL
  Filled 2013-09-15: qty 4

## 2013-09-15 NOTE — Progress Notes (Signed)
Pt is a 43 y/o F with PMhx of asthma, ovarian cysts, HTN presenting to the ED with pelvis pain that has been ongoing for the past 2 months with worsening over the past couple of days. Patient reported that the pain is localized to the right and left pelvic regions with most discomfort to the right pelvic region. Patient described the pain as a "sticking" pain, as if someone is taking a knife and sticking her in the abdomen. Patient reported that the left pelvic region pain radiates to the left lower back. Reported that she has been taking Percocets with minimal relief - stated that she was seen and assessed by an OB/GYN physician who prescribed her Percocets, reported that when she filled these Percocet she was unable to take it for one day, reported that she left her house and medications were gone - reported that she has been using her fiances medication. Stated that she has noticed hematuria over the past couple of days. Denied menstrual cycle since she has had a hysterectomy more than 5 years ago. Denied nausea, vomiting, diarrhea, melena, hematochezia, vaginal discharge, vaginal pain, abnormal vaginal bleeding, numbness, tingling, dysuria, chest pain, shortness of breath, difficulty breathing, weakness, headache, dizziness.  CT scan concerning for TOA or pyosalpinges, so will start Zosyn 3.375mg  IV (extended interval) every 8 hours.  CrCl estimated to be 125 ml/min.  Hurley CiscoMendenhall, Gerasimos Plotts D, Pharm.D.

## 2013-09-15 NOTE — H&P (Addendum)
to the ED with pelvis pain that has been ongoing for the past 2 months with worsening over the past couple of days. Patient reported that the pain is localized to the right and left pelvic regions with most discomfort to the right pelvic region. Patient described the pain as a "sticking" pain, as if someone is taking a knife and sticking her in the abdomen. Patient reported that the left pelvic region pain radiates to the left lower back. Reported that she has been taking Percocets with minimal relief - stated that she was seen and assessed by an OB/GYN physician who prescribed her Percocets, reported that when she filled these Percocet she was unable to take it for one day, reported that she left her house and medications were gone - reported that she has been using her fiances medication. Stated that she has noticed hematuria over the past couple of days. Denied menstrual cycle since she has had a hysterectomy more than 5 years ago. Denied nausea, vomiting, diarrhea, melena, hematochezia, vaginal discharge, vaginal pain, abnormal vaginal bleeding, numbness, tingling, dysuria, chest pain, shortness of breath, difficulty breathing, weakness, headache, dizziness.  PCP none   She has seen Dr. Shawnie Pons this month for the same problem and is scheduled for removal of both of her oviducts in the near future. I reccomended that the patient be sent home and follow up at the clinic Monday to see if we should move up her surgery, but I kept getting repeat phone calls from the PA at [facility] who questioned this plan so I aquiesced and had the patient transferred to [facility] for observation.  Ivery Quale was noted on a wet prep and she was given po flagyl. They also gave her IM rocephin, and a gram of zithromax.      No LMP recorded. Patient has had a hysterectomy.    Past Medical History  Diagnosis Date  . Heart murmur   . Asthma   . Hypertension   . Anxiety     Past Surgical History  Procedure Laterality Date  .  Appendectomy    . Cesarean section    . Abdominal hysterectomy      Family History  Problem Relation Age of Onset  . Cancer Mother 43    breast  . Hypertension Mother   . Heart disease Father   . Stroke Father   . Arthritis Daughter   . Seizures Son   . Hypertension Maternal Grandmother   . Arthritis Daughter     Social History:  reports that she has been smoking Cigarettes.  She has been smoking about 0.50 packs per day. She has never used smokeless tobacco. She reports that she does not drink alcohol or use illicit drugs.  Allergies:  Allergies  Allergen Reactions  . Asa [Aspirin] Hives and Swelling  . Ultram [Tramadol] Nausea And Vomiting    Prescriptions prior to admission  Medication Sig Dispense Refill  . albuterol (PROVENTIL HFA;VENTOLIN HFA) 108 (90 BASE) MCG/ACT inhaler Inhale 2 puffs into the lungs every 6 (six) hours as needed for wheezing or shortness of breath.      . cloNIDine (CATAPRES) 0.2 MG tablet Take 0.2 mg by mouth 2 (two) times daily.        ROS  Blood pressure 167/87, pulse 66, temperature 97.8 F (36.6 C), temperature source Oral, resp. rate 18, height 6\' 1"  (1.854 m), weight 101.606 kg (224 lb), SpO2 100.00%. Physical Exam Heart- rrr Lungs- CTAB Abd- benign  Results for orders placed during the  hospital encounter of 09/14/13 (from the past 24 hour(s))  URINALYSIS, ROUTINE W REFLEX MICROSCOPIC     Status: Abnormal   Collection Time    09/14/13 10:48 PM      Result Value Ref Range   Color, Urine YELLOW  YELLOW   APPearance CLOUDY (*) CLEAR   Specific Gravity, Urine 1.026  1.005 - 1.030   pH 5.5  5.0 - 8.0   Glucose, UA NEGATIVE  NEGATIVE mg/dL   Hgb urine dipstick SMALL (*) NEGATIVE   Bilirubin Urine NEGATIVE  NEGATIVE   Ketones, ur NEGATIVE  NEGATIVE mg/dL   Protein, ur 161 (*) NEGATIVE mg/dL   Urobilinogen, UA 1.0  0.0 - 1.0 mg/dL   Nitrite NEGATIVE  NEGATIVE   Leukocytes, UA SMALL (*) NEGATIVE  URINE MICROSCOPIC-ADD ON     Status:  Abnormal   Collection Time    09/14/13 10:48 PM      Result Value Ref Range   Squamous Epithelial / LPF MANY (*) RARE   WBC, UA 7-10  <3 WBC/hpf   RBC / HPF 3-6  <3 RBC/hpf   Bacteria, UA FEW (*) RARE   Urine-Other MUCOUS PRESENT    POC URINE PREG, ED     Status: None   Collection Time    09/14/13 10:55 PM      Result Value Ref Range   Preg Test, Ur NEGATIVE  NEGATIVE  CBC WITH DIFFERENTIAL     Status: Abnormal   Collection Time    09/15/13 12:03 AM      Result Value Ref Range   WBC 11.6 (*) 4.0 - 10.5 K/uL   RBC 4.36  3.87 - 5.11 MIL/uL   Hemoglobin 12.9  12.0 - 15.0 g/dL   HCT 09.6  04.5 - 40.9 %   MCV 89.7  78.0 - 100.0 fL   MCH 29.6  26.0 - 34.0 pg   MCHC 33.0  30.0 - 36.0 g/dL   RDW 81.1  91.4 - 78.2 %   Platelets 198  150 - 400 K/uL   Neutrophils Relative % 52  43 - 77 %   Neutro Abs 6.0  1.7 - 7.7 K/uL   Lymphocytes Relative 39  12 - 46 %   Lymphs Abs 4.5 (*) 0.7 - 4.0 K/uL   Monocytes Relative 5  3 - 12 %   Monocytes Absolute 0.6  0.1 - 1.0 K/uL   Eosinophils Relative 3  0 - 5 %   Eosinophils Absolute 0.4  0.0 - 0.7 K/uL   Basophils Relative 1  0 - 1 %   Basophils Absolute 0.1  0.0 - 0.1 K/uL  COMPREHENSIVE METABOLIC PANEL     Status: Abnormal   Collection Time    09/15/13 12:03 AM      Result Value Ref Range   Sodium 141  137 - 147 mEq/L   Potassium 3.7  3.7 - 5.3 mEq/L   Chloride 102  96 - 112 mEq/L   CO2 26  19 - 32 mEq/L   Glucose, Bld 101 (*) 70 - 99 mg/dL   BUN 7  6 - 23 mg/dL   Creatinine, Ser 9.56 (*) 0.50 - 1.10 mg/dL   Calcium 9.4  8.4 - 21.3 mg/dL   Total Protein 6.7  6.0 - 8.3 g/dL   Albumin 3.4 (*) 3.5 - 5.2 g/dL   AST 12  0 - 37 U/L   ALT 8  0 - 35 U/L   Alkaline Phosphatase 67  39 -  117 U/L   Total Bilirubin <0.2 (*) 0.3 - 1.2 mg/dL   GFR calc non Af Amer >90  >90 mL/min   GFR calc Af Amer >90  >90 mL/min  LIPASE, BLOOD     Status: None   Collection Time    09/15/13 12:03 AM      Result Value Ref Range   Lipase 23  11 - 59 U/L  WET  PREP, GENITAL     Status: Abnormal   Collection Time    09/15/13  1:57 AM      Result Value Ref Range   Yeast Wet Prep HPF POC NONE SEEN  NONE SEEN   Trich, Wet Prep MANY (*) NONE SEEN   Clue Cells Wet Prep HPF POC MANY (*) NONE SEEN   WBC, Wet Prep HPF POC FEW (*) NONE SEEN    [place] Transvaginal Non-ob  09/15/2013   CLINICAL DATA:  Abdominal pain. Elevated white blood cell count. Cystic lesions within the pelvis on prior ultrasound and CT.  EXAM: TRANSABDOMINAL AND TRANSVAGINAL ULTRASOUND OF PELVIS  DOPPLER ULTRASOUND OF OVARIES  TECHNIQUE: Both transabdominal and transvaginal ultrasound examinations of the pelvis were performed. Transabdominal technique was performed for global imaging of the pelvis including uterus, ovaries, adnexal regions, and pelvic cul-de-sac.  It was necessary to proceed with endovaginal exam following the transabdominal exam to visualize the ovaries . Color and duplex Doppler ultrasound was utilized to evaluate blood flow to the ovaries.  COMPARISON:  US TRANSVAGINAL NON-OB dated 08/31/2013  FINDINGS: Uterus  Measurements: Surgically absent.  There is a large complex tubular cystic structure within the mid pelvis extending to the right adnexa is increased in a complexity compared to prior with multiple internal septations. This has the appearance of an abscess. This tubular cystic lesions with internal septation measures 9.3 x 4.0 x 4.1 cm and is increased in complexity compared to prior.  There appears to be normal ovarian tissue within the right adnexa with normal arterial and venous spectral waveforms. This normal portion of the ovary is adjacent to the complex septated cystic lesion and measures 3.3 x 2.2 cm.  The left ovary appears normal and is only identified transabdominally. Left ovary measures 2.1 x 5.1 x 2.7 cm  The right fallopian tube is enlarged.  IMPRESSION:  1. Increased in complexity of tubular cystic structure in the central pelvis and right adnexa with new internal  septations is most concerning for tubo-ovarian abscess or hydro pyosalpinx. 2. Relatively normal right ovarian tissue is identified with flow so ovarian torsion is felt less likely. 3. The left ovary appears normal in size and with normal flow Findings conveyed toMARISSA SCIACCA on 09/15/2013  at01:45.   Electronically Signed   By: Genevive BiStewart  Edmunds M.D.   On: 09/15/2013 01:46   [place] Pelvis Complete  09/15/2013   CLINICAL DATA:  Abdominal pain. Elevated white blood cell count. Cystic lesions within the pelvis on prior ultrasound and CT.  EXAM: TRANSABDOMINAL AND TRANSVAGINAL ULTRASOUND OF PELVIS  DOPPLER ULTRASOUND OF OVARIES  TECHNIQUE: Both transabdominal and transvaginal ultrasound examinations of the pelvis were performed. Transabdominal technique was performed for global imaging of the pelvis including uterus, ovaries, adnexal regions, and pelvic cul-de-sac.  It was necessary to proceed with endovaginal exam following the transabdominal exam to visualize the ovaries . Color and duplex Doppler ultrasound was utilized to evaluate blood flow to the ovaries.  COMPARISON:  US TRANSVAGINAL NON-OB dated 08/31/2013  FINDINGS: Uterus  Measurements: Surgically absent.  There is a  large complex tubular cystic structure within the mid pelvis extending to the right adnexa is increased in a complexity compared to prior with multiple internal septations. This has the appearance of an abscess. This tubular cystic lesions with internal septation measures 9.3 x 4.0 x 4.1 cm and is increased in complexity compared to prior.  There appears to be normal ovarian tissue within the right adnexa with normal arterial and venous spectral waveforms. This normal portion of the ovary is adjacent to the complex septated cystic lesion and measures 3.3 x 2.2 cm.  The left ovary appears normal and is only identified transabdominally. Left ovary measures 2.1 x 5.1 x 2.7 cm  The right fallopian tube is enlarged.  IMPRESSION:  1. Increased in  complexity of tubular cystic structure in the central pelvis and right adnexa with new internal septations is most concerning for tubo-ovarian abscess or hydro pyosalpinx. 2. Relatively normal right ovarian tissue is identified with flow so ovarian torsion is felt less likely. 3. The left ovary appears normal in size and with normal flow Findings conveyed toMARISSA SCIACCA on 09/15/2013  at01:45.   Electronically Signed   By: Genevive Bi M.D.   On: 09/15/2013 01:46   [place] Art/ven Flow Abd Pelv Doppler  09/15/2013   CLINICAL DATA:  Abdominal pain. Elevated white blood cell count. Cystic lesions within the pelvis on prior ultrasound and CT.  EXAM: TRANSABDOMINAL AND TRANSVAGINAL ULTRASOUND OF PELVIS  DOPPLER ULTRASOUND OF OVARIES  TECHNIQUE: Both transabdominal and transvaginal ultrasound examinations of the pelvis were performed. Transabdominal technique was performed for global imaging of the pelvis including uterus, ovaries, adnexal regions, and pelvic cul-de-sac.  It was necessary to proceed with endovaginal exam following the transabdominal exam to visualize the ovaries . Color and duplex Doppler ultrasound was utilized to evaluate blood flow to the ovaries.  COMPARISON:  US TRANSVAGINAL NON-OB dated 08/31/2013  FINDINGS: Uterus  Measurements: Surgically absent.  There is a large complex tubular cystic structure within the mid pelvis extending to the right adnexa is increased in a complexity compared to prior with multiple internal septations. This has the appearance of an abscess. This tubular cystic lesions with internal septation measures 9.3 x 4.0 x 4.1 cm and is increased in complexity compared to prior.  There appears to be normal ovarian tissue within the right adnexa with normal arterial and venous spectral waveforms. This normal portion of the ovary is adjacent to the complex septated cystic lesion and measures 3.3 x 2.2 cm.  The left ovary appears normal and is only identified transabdominally. Left  ovary measures 2.1 x 5.1 x 2.7 cm  The right fallopian tube is enlarged.  IMPRESSION:  1. Increased in complexity of tubular cystic structure in the central pelvis and right adnexa with new internal septations is most concerning for tubo-ovarian abscess or hydro pyosalpinx. 2. Relatively normal right ovarian tissue is identified with flow so ovarian torsion is felt less likely. 3. The left ovary appears normal in size and with normal flow Findings conveyed toMARISSA SCIACCA on 09/15/2013  at01:45.   Electronically Signed   By: Genevive Bi M.D.   On: 09/15/2013 01:46    Assessment/Plan: Probable pyosalpinges- As above, I agreed to admit her to [facility]. She will receive IV zosyn, po flagyl for her trich. We may move up her surgery prn.  Traylen Eckels C. 09/15/2013, 7:33 AM

## 2013-09-15 NOTE — ED Provider Notes (Signed)
Medical screening examination/treatment/procedure(s) were performed by non-physician practitioner and as supervising physician I was immediately available for consultation/collaboration.   EKG Interpretation None       Sunnie NielsenBrian Marcques Wrightsman, MD 09/15/13 2354

## 2013-09-16 ENCOUNTER — Encounter (HOSPITAL_COMMUNITY): Payer: Self-pay | Admitting: Radiology

## 2013-09-16 ENCOUNTER — Observation Stay (HOSPITAL_COMMUNITY): Payer: Self-pay

## 2013-09-16 DIAGNOSIS — N7093 Salpingitis and oophoritis, unspecified: Secondary | ICD-10-CM | POA: Diagnosis present

## 2013-09-16 LAB — CBC
HEMATOCRIT: 38.2 % (ref 36.0–46.0)
Hemoglobin: 12.4 g/dL (ref 12.0–15.0)
MCH: 29.6 pg (ref 26.0–34.0)
MCHC: 32.5 g/dL (ref 30.0–36.0)
MCV: 91.2 fL (ref 78.0–100.0)
PLATELETS: 187 10*3/uL (ref 150–400)
RBC: 4.19 MIL/uL (ref 3.87–5.11)
RDW: 13.8 % (ref 11.5–15.5)
WBC: 10.2 10*3/uL (ref 4.0–10.5)

## 2013-09-16 LAB — GC/CHLAMYDIA PROBE AMP
CT PROBE, AMP APTIMA: NEGATIVE
CT Probe RNA: NEGATIVE
GC Probe RNA: NEGATIVE
GC Probe RNA: NEGATIVE

## 2013-09-16 MED ORDER — HYDROCHLOROTHIAZIDE 25 MG PO TABS
25.0000 mg | ORAL_TABLET | Freq: Every day | ORAL | Status: DC
  Filled 2013-09-16: qty 1

## 2013-09-16 MED ORDER — HYDROCHLOROTHIAZIDE 12.5 MG PO CAPS
12.5000 mg | ORAL_CAPSULE | Freq: Every day | ORAL | Status: DC
  Filled 2013-09-16 (×2): qty 1

## 2013-09-16 MED ORDER — IOHEXOL 300 MG/ML  SOLN
100.0000 mL | Freq: Once | INTRAMUSCULAR | Status: AC | PRN
  Administered 2013-09-16: 100 mL via INTRAVENOUS

## 2013-09-16 MED ORDER — LISINOPRIL 10 MG PO TABS
10.0000 mg | ORAL_TABLET | Freq: Every day | ORAL | Status: DC
  Filled 2013-09-16 (×2): qty 1

## 2013-09-16 MED ORDER — HYDROCHLOROTHIAZIDE 25 MG PO TABS
25.0000 mg | ORAL_TABLET | Freq: Every day | ORAL | Status: DC
  Administered 2013-09-16 – 2013-09-17 (×2): 25 mg via ORAL
  Filled 2013-09-16 (×3): qty 1

## 2013-09-16 NOTE — Progress Notes (Signed)
Ur chart review completed.  

## 2013-09-16 NOTE — Progress Notes (Signed)
IR PA aware of request for image guided pelvic drain placement, Dr. Lowella DandyHenn has reviewed and approved this case. Patient will be NPO after midnight, all blood thinners held 3/24, and carelink order placed [facility], patient is able to sign consent and PA will see and consent 3/24. I have spoke to the RN today regarding the plan.   Pattricia BossKoreen Matheau [place]rona PA-C Interventional Radiology  09/16/13  3:05 PM

## 2013-09-16 NOTE — Progress Notes (Signed)
CSW consult received to assess "financial concerns & nursing home placement."  According to [facility], pt is interested in applying for Medicaid & has already talked with Fausto Skillerneyna South, hospital financial counselor.  Nursing home placement is incorrect.  CSW intervention was not provided.

## 2013-09-17 ENCOUNTER — Ambulatory Visit (HOSPITAL_COMMUNITY): Payer: Self-pay | Attending: Obstetrics & Gynecology

## 2013-09-17 ENCOUNTER — Encounter (HOSPITAL_COMMUNITY): Payer: Self-pay

## 2013-09-17 ENCOUNTER — Ambulatory Visit (HOSPITAL_COMMUNITY)
Admit: 2013-09-17 | Discharge: 2013-09-17 | Disposition: A | Discharge status: Home / Self Care | Payer: Self-pay | Attending: Radiology | Admitting: Radiology

## 2013-09-17 DIAGNOSIS — N949 Unspecified condition associated with female genital organs and menstrual cycle: Secondary | ICD-10-CM

## 2013-09-17 DIAGNOSIS — F17200 Nicotine dependence, unspecified, uncomplicated: Secondary | ICD-10-CM

## 2013-09-17 DIAGNOSIS — I10 Essential (primary) hypertension: Secondary | ICD-10-CM

## 2013-09-17 DIAGNOSIS — N7013 Chronic salpingitis and oophoritis: Secondary | ICD-10-CM

## 2013-09-17 LAB — PROTIME-INR
INR: 1.05 (ref 0.00–1.49)
PROTHROMBIN TIME: 13.5 s (ref 11.6–15.2)

## 2013-09-17 MED ORDER — MIDAZOLAM HCL 2 MG/2ML IJ SOLN
INTRAMUSCULAR | Status: AC
  Filled 2013-09-17: qty 4

## 2013-09-17 MED ORDER — MIDAZOLAM HCL 2 MG/2ML IJ SOLN
INTRAMUSCULAR | Status: AC | PRN
  Administered 2013-09-17: 1 mg via INTRAVENOUS
  Administered 2013-09-17: 2 mg via INTRAVENOUS
  Administered 2013-09-17: 1 mg via INTRAVENOUS

## 2013-09-17 MED ORDER — FENTANYL CITRATE 0.05 MG/ML IJ SOLN
INTRAMUSCULAR | Status: AC | PRN
  Administered 2013-09-17 (×3): 50 ug via INTRAVENOUS

## 2013-09-17 MED ORDER — SODIUM CHLORIDE 0.9 % IV SOLN
INTRAVENOUS | Status: DC
  Administered 2013-09-17 – 2013-09-18 (×2): via INTRAVENOUS

## 2013-09-17 MED ORDER — FENTANYL CITRATE 0.05 MG/ML IJ SOLN
INTRAMUSCULAR | Status: AC
  Filled 2013-09-17: qty 4

## 2013-09-17 NOTE — Sedation Documentation (Signed)
Care Link here for [place] back to [facility]

## 2013-09-17 NOTE — Sedation Documentation (Signed)
MD at bedside. Explaining procedure to pt.  Questions answered.

## 2013-09-17 NOTE — Sedation Documentation (Signed)
In CT 3, preparing for drain

## 2013-09-17 NOTE — Sedation Documentation (Signed)
Escorted tp BR to void. Steady on feet.  VSS.

## 2013-09-17 NOTE — Sedation Documentation (Signed)
65 cc yellow thin fluid removed.

## 2013-09-17 NOTE — Sedation Documentation (Signed)
MD at bedside.  Explaining procedure.

## 2013-09-17 NOTE — Procedures (Signed)
Successful RT PELVIC FLD COLLECTION NEEDLE ASPIRATION 65CC THIN CLEAR SEROSANG FLD ASPIRATED NO COMP STABLE GS/CX SENT

## 2013-09-17 NOTE — Sedation Documentation (Signed)
O2 2l/[place]y started 

## 2013-09-17 NOTE — H&P (Signed)
Jill Wright is an 43 y.o. female.   Chief Complaint: Abdominal pain x 2 months- worsening To ED 09/15/13 with fever and abd pain; high wbc [place] and CT reveal Tubo-Ovarian abscess Scheduled now for abscess drain placement  HPI: HTN; asthma; smoker  Past Medical History  Diagnosis Date  . Heart murmur   . Asthma   . Hypertension   . Anxiety     Past Surgical History  Procedure Laterality Date  . Appendectomy    . Cesarean section    . Abdominal hysterectomy      Family History  Problem Relation Age of Onset  . Cancer Mother 55    breast  . Hypertension Mother   . Heart disease Father   . Stroke Father   . Arthritis Daughter   . Seizures Son   . Hypertension Maternal Grandmother   . Arthritis Daughter    Social History:  reports that she has been smoking Cigarettes.  She has been smoking about 0.50 packs per day. She has never used smokeless tobacco. She reports that she does not drink alcohol or use illicit drugs.  Allergies:  Allergies  Allergen Reactions  . Lisinopril Anaphylaxis  . Asa [Aspirin] Hives and Swelling  . Ultram [Tramadol] Nausea And Vomiting     (Not in a hospital admission)  Results for orders placed during the hospital encounter of 09/14/13 (from the past 48 hour(s))  CBC     Status: None   Collection Time    09/16/13  5:32 AM      Result Value Ref Range   WBC 10.2  4.0 - 10.5 K/uL   RBC 4.19  3.87 - 5.11 MIL/uL   Hemoglobin 12.4  12.0 - 15.0 g/dL   HCT 45.4  09.8 - 11.9 %   MCV 91.2  78.0 - 100.0 fL   MCH 29.6  26.0 - 34.0 pg   MCHC 32.5  30.0 - 36.0 g/dL   RDW 14.7  82.9 - 56.2 %   Platelets 187  150 - 400 K/uL  PROTIME-INR     Status: None   Collection Time    09/17/13  5:21 AM      Result Value Ref Range   Prothrombin Time 13.5  11.6 - 15.2 seconds   INR 1.05  0.00 - 1.49   Ct Abdomen Pelvis W Contrast  09/16/2013   CLINICAL DATA:  Evaluate pelvic fluid collections. Patient complains of pelvic pain. Evaluate for  percutaneous drainage.  EXAM: CT ABDOMEN AND PELVIS WITH CONTRAST  TECHNIQUE: Multidetector CT imaging of the abdomen and pelvis was performed using the standard protocol following bolus administration of intravenous contrast.  CONTRAST:  OMNIPAQUE IOHEXOL 300 MG/ML  SOLN  COMPARISON:  CT 07/30/2013 and ultrasound 09/15/2013  FINDINGS: The lung bases are clear.  No evidence for free air.  Normal appearance of the liver, gallbladder and portal venous system. Normal appearance of the spleen and pancreas and adrenal glands. Low-density structures in both kidneys are most compatible with cysts. No evidence for hydronephrosis. Incidentally, the patient has a circumaortic left renal vein.  There is a complex fluid collection or fluid collections in the pelvis. On the right side, a collection measures 6.1 x 3.1 cm. A collection near the mid pelvis measures 6.7 x 3.9 cm. There are additional fluid collections in the pelvis. Uterus is absent. Small low-density structure involving the left ovary. There may be a trace amount of free fluid. Surgical clips near the cecum probably related  to an appendectomy. Mild inflammatory changes in the lower abdomen and upper pelvic region near these pelvic fluid collections.  No acute bone abnormality.  IMPRESSION: Enlargement of the fluid collection(s) in the pelvis since 07/30/2013. Based on the recent ultrasound, these fluid collections may be associated with a hydrosalpinx. Findings concerning for an underlying infectious or inflammatory process.  These results were called by telephone at the time of interpretation on 09/16/2013 at 2:53 PM to Dr. Elsie LincolnKELLY LEGGETT , who verbally acknowledged these results.   Electronically Signed   By: Richarda OverlieAdam  Henn M.D.   On: 09/16/2013 15:00    Review of Systems  Constitutional: Negative for fever.  Respiratory: Negative for shortness of breath.   Cardiovascular: Negative for chest pain.  Gastrointestinal: Positive for abdominal pain. Negative  for nausea and vomiting.  Musculoskeletal: Negative for neck pain.  Neurological: Positive for weakness. Negative for dizziness and headaches.  Psychiatric/Behavioral: Negative for substance abuse.    There were no vitals taken for this visit. Physical Exam  Constitutional: She is oriented to person, place, and time. She appears well-nourished.  Cardiovascular: Normal rate and regular rhythm.   Respiratory: Effort normal and breath sounds normal.  GI: Soft. Bowel sounds are normal. There is tenderness.  Musculoskeletal: Normal range of motion.  Neurological: She is alert and oriented to person, place, and time.  Skin: Skin is warm and dry.  Psychiatric: She has a normal mood and affect. Her behavior is normal. Judgment and thought content normal.     Assessment/Plan Tubo- ovarian abscess Scheduled for drain placement Pt aware of procedure benefits and risks and agreeable to proceed Consent signed andin chart  Felicha Frayne A 09/17/2013, 10:58 AM

## 2013-09-17 NOTE — Progress Notes (Signed)
Subjective: Patient reports right sided abdominal cramping, has been NPO since last night for IR procedure this morning to drain 7 cm TOA.   Objective: I have reviewed patient's vital signs, intake and output, medications, labs and radiology results. Temp:  [97.9 F (36.6 C)-98.5 F (36.9 C)] 97.9 F (36.6 C) (03/24 0601) Pulse Rate:  [58-63] 58 (03/24 0601) Resp:  [16-18] 16 (03/24 0601) BP: (140-170)/(87-98) 152/94 mmHg (03/24 0601) SpO2:  [96 %-99 %] 99 % (03/24 0601) Weight:  [231 lb 8 oz (105.008 kg)] 231 lb 8 oz (105.008 kg) (03/24 0601) General: alert and no distress GI: soft, RLQ tenderness; bowel sounds normal; no masses,  no organomegaly Extremities: extremities normal, atraumatic, no cyanosis or edema and Homans sign is negative, no sign of DVT   Assessment/Plan: IR procedure as scheduled; continue Zosyn Will follow up results of studies Routine gynecologic care    LOS: 3 days   Tereso NewcomerUgonna A Tigerlily Christine, MD 09/17/2013, 7:51 AM

## 2013-09-18 DIAGNOSIS — N7011 Chronic salpingitis: Secondary | ICD-10-CM | POA: Diagnosis present

## 2013-09-18 MED ORDER — OXYCODONE-ACETAMINOPHEN 5-325 MG PO TABS: 1.0000 | ORAL_TABLET | Freq: Four times a day (QID) | ORAL | Status: DC | PRN

## 2013-09-18 NOTE — Discharge Instructions (Signed)
Salpingectomy Salpingectomy, also called tubectomy, is the surgical removal of one of the fallopian tubes. The fallopian tubes are tubes that are connected to the uterus. These tubes transport the egg from the ovary to the uterus. A salpingectomy may be done for various reasons, including:   A tubal (ectopic) pregnancy. This is especially true if the tube ruptures.  An infected fallopian tube.  The need to remove the fallopian tube when removing an ovary with a cyst or tumor.  The need to remove the fallopian tube when removing the uterus.  Cancer of the fallopian tube or nearby organs. Removing one fallopian tube does not prevent you from becoming pregnant. It also does not cause problems with your menstrual periods.  LET YOUR HEALTH CARE PROVIDER KNOW ABOUT:  Any allergies you have.  All medicines you are taking, including vitamins, herbs, eye drops, creams, and over-the-counter medicines.  Previous problems you or members of your family have had with the use of anesthetics.  Any blood disorders you have.  Previous surgeries you have had.  Medical conditions you have. RISKS AND COMPLICATIONS  Generally, this is a safe procedure. However, as with any procedure, complications can occur. Possible complications include:  Injury to surrounding organs.  Bleeding.  Infection.  Problems related to anesthesia. BEFORE THE PROCEDURE  Ask your health care provider about changing or stopping your regular medicines. You may need to stop taking certain medicines, such as aspirin or blood thinners, at least 1 week before the surgery.  Do not eat or drink anything for at least 8 hours before the surgery.  If you smoke, do not smoke for at least 2 weeks before the surgery.  Make plans to have someone drive you home after the procedure or after your hospital stay. Also arrange for someone to help you with activities during recovery. PROCEDURE   You will be given medicine to help you  relax before the procedure (sedative). You will then be given medicine to make you sleep through the procedure (general anesthetic). These medicines will be given through an IV access tube that is put into one of your veins.  Once you are asleep, your lower abdomen will be shaved and cleaned. A thin, flexible tube (catheter) will be placed in your bladder.  The surgeon may use a laparoscopic, robotic, or open technique for this surgery:  In the laparoscopic technique, the surgery is done through two small cuts (incisions) in the abdomen. A thin, lighted tube with a tiny camera on the end (laparoscope) is inserted into one of the incisions. The tools needed for the procedure are put through the other incision.  A robotic technique may be chosen to perform complex surgery in a small space. In the robotic technique, small incisions will be made. A camera and surgical instruments are passed through the incisions. Surgical instruments will be controlled with the help of a robotic arm.  In the open technique, the surgery is done through one large incision in the abdomen.  Using any of these techniques, the surgeon removes the fallopian tube from where it attaches to the uterus. The blood vessels will be clamped and tied.  The surgeon then uses staples or stitches to close the incision or incisions. AFTER THE PROCEDURE   You will be taken to a recovery area where your progress will be monitored for 1 3 hours.  If the laparoscopic technique was used, you may be allowed to go home after several hours. You may have some shoulder pain   after the laparoscopic procedure. This is normal and usually goes away in a day or two.  If the open technique was used, you will be admitted to the hospital for a couple of days.  You will be given pain medicine if needed.  The IV access tube and catheter will be removed before you are discharged. Document Released: 10/30/2008 Document Revised: 04/03/2013 Document  Reviewed: 12/05/2012 ExitCare Patient Information 2014 [employer].  

## 2013-09-18 NOTE — Progress Notes (Signed)
Pt   ambulated out and [facility]  Teaching  Complete

## 2013-09-18 NOTE — Discharge Summary (Signed)
Gynecology Physician Discharge Summary  Patient ID: Jill Wright MRN: [id number] DOB/AGE: [id number] 42 y.o.  Admit date: 09/14/2013 Discharge date: 09/18/2013  Admission Diagnoses: Abdominal pain  Possible TOA HTN Asthma Tobacco use  Discharge Diagnoses:   Hydrosalpinx No evidence of TOA HTN Asthma Tobacco Use  Pertinent Labs: Results for orders placed during the hospital encounter of 09/14/13 (from the past 336 hour(s))  URINALYSIS, ROUTINE W REFLEX MICROSCOPIC   Collection Time    09/14/13 10:48 PM      Result Value Ref Range   Color, Urine YELLOW  YELLOW   APPearance CLOUDY (*) CLEAR   Specific Gravity, Urine 1.026  1.005 - 1.030   pH 5.5  5.0 - 8.0   Glucose, UA NEGATIVE  NEGATIVE mg/dL   Hgb urine dipstick SMALL (*) NEGATIVE   Bilirubin Urine NEGATIVE  NEGATIVE   Ketones, ur NEGATIVE  NEGATIVE mg/dL   Protein, ur 409 (*) NEGATIVE mg/dL   Urobilinogen, UA 1.0  0.0 - 1.0 mg/dL   Nitrite NEGATIVE  NEGATIVE   Leukocytes, UA SMALL (*) NEGATIVE  URINE MICROSCOPIC-ADD ON   Collection Time    09/14/13 10:48 PM      Result Value Ref Range   Squamous Epithelial / LPF MANY (*) RARE   WBC, UA 7-10  <3 WBC/hpf   RBC / HPF 3-6  <3 RBC/hpf   Bacteria, UA FEW (*) RARE   Urine-Other MUCOUS PRESENT    POC URINE PREG, ED   Collection Time    09/14/13 10:55 PM      Result Value Ref Range   Preg Test, Ur NEGATIVE  NEGATIVE  CBC WITH DIFFERENTIAL   Collection Time    09/15/13 12:03 AM      Result Value Ref Range   WBC 11.6 (*) 4.0 - 10.5 K/uL   RBC 4.36  3.87 - 5.11 MIL/uL   Hemoglobin 12.9  12.0 - 15.0 g/dL   HCT 81.1  91.4 - 78.2 %   MCV 89.7  78.0 - 100.0 fL   MCH 29.6  26.0 - 34.0 pg   MCHC 33.0  30.0 - 36.0 g/dL   RDW 95.6  21.3 - 08.6 %   Platelets 198  150 - 400 K/uL   Neutrophils Relative % 52  43 - 77 %   Neutro Abs 6.0  1.7 - 7.7 K/uL   Lymphocytes Relative 39  12 - 46 %   Lymphs Abs 4.5 (*) 0.7 - 4.0 K/uL   Monocytes Relative 5  3 - 12 %    Monocytes Absolute 0.6  0.1 - 1.0 K/uL   Eosinophils Relative 3  0 - 5 %   Eosinophils Absolute 0.4  0.0 - 0.7 K/uL   Basophils Relative 1  0 - 1 %   Basophils Absolute 0.1  0.0 - 0.1 K/uL  COMPREHENSIVE METABOLIC PANEL   Collection Time    09/15/13 12:03 AM      Result Value Ref Range   Sodium 141  137 - 147 mEq/L   Potassium 3.7  3.7 - 5.3 mEq/L   Chloride 102  96 - 112 mEq/L   CO2 26  19 - 32 mEq/L   Glucose, Bld 101 (*) 70 - 99 mg/dL   BUN 7  6 - 23 mg/dL   Creatinine, Ser 5.78 (*) 0.50 - 1.10 mg/dL   Calcium 9.4  8.4 - 46.9 mg/dL   Total Protein 6.7  6.0 - 8.3 g/dL   Albumin 3.4 (*) 3.5 -  5.2 g/dL   AST 12  0 - 37 U/L   ALT 8  0 - 35 U/L   Alkaline Phosphatase 67  39 - 117 U/L   Total Bilirubin <0.2 (*) 0.3 - 1.2 mg/dL   GFR calc non Af Amer >90  >90 mL/min   GFR calc Af Amer >90  >90 mL/min  LIPASE, BLOOD   Collection Time    09/15/13 12:03 AM      Result Value Ref Range   Lipase 23  11 - 59 U/L  GC/CHLAMYDIA PROBE AMP   Collection Time    09/15/13  1:57 AM      Result Value Ref Range   CT Probe RNA NEGATIVE  NEGATIVE   GC Probe RNA NEGATIVE  NEGATIVE  WET PREP, GENITAL   Collection Time    09/15/13  1:57 AM      Result Value Ref Range   Yeast Wet Prep HPF POC NONE SEEN  NONE SEEN   Trich, Wet Prep MANY (*) NONE SEEN   Clue Cells Wet Prep HPF POC MANY (*) NONE SEEN   WBC, Wet Prep HPF POC FEW (*) NONE SEEN  HIV ANTIBODY (ROUTINE TESTING)   Collection Time    09/15/13  8:05 AM      Result Value Ref Range   HIV NON REACTIVE  NON REACTIVE  GC/CHLAMYDIA PROBE AMP   Collection Time    09/15/13 10:30 AM      Result Value Ref Range   CT Probe RNA NEGATIVE  NEGATIVE   GC Probe RNA NEGATIVE  NEGATIVE  CBC   Collection Time    09/16/13  5:32 AM      Result Value Ref Range   WBC 10.2  4.0 - 10.5 K/uL   RBC 4.19  3.87 - 5.11 MIL/uL   Hemoglobin 12.4  12.0 - 15.0 g/dL   HCT 78.238.2  95.636.0 - 21.346.0 %   MCV 91.2  78.0 - 100.0 fL   MCH 29.6  26.0 - 34.0 pg   MCHC 32.5   30.0 - 36.0 g/dL   RDW 08.613.8  57.811.5 - 46.915.5 %   Platelets 187  150 - 400 K/uL  PROTIME-INR   Collection Time    09/17/13  5:21 AM      Result Value Ref Range   Prothrombin Time 13.5  11.6 - 15.2 seconds   INR 1.05  0.00 - 1.49  Results for orders placed during the hospital encounter of 09/17/13 (from the past 336 hour(s))  BODY FLUID CULTURE   Collection Time    09/17/13  2:16 PM      Result Value Ref Range   Specimen Description FLUID     Special Requests RIGHT LOWER QUAD PELVIC FLUID     Gram Stain       Value: NO SQUAMOUS EPITHELIAL CELLS SEEN     NO WBC SEEN     NO ORGANISMS SEEN     Performed at [facility]   Culture PENDING     Report Status PENDING    ANAEROBIC CULTURE   Collection Time    09/17/13  2:16 PM      Result Value Ref Range   Specimen Description FLUID     Special Requests RIGHT LOWER QUAD PELVIC FLUID     Gram Stain       Value: NO SQUAMOUS EPITHELIAL CELLS SEEN     NO WBC SEEN     NO ORGANISMS SEEN  Performed at [facility] PENDING     Report Status PENDING     Pertinent Radiological Findings: US Transvaginal Non-ob  09/16/2013   ADDENDUM REPORT: 09/16/2013 12:29  ADDENDUM: The septated 10.7 cm cystic structure is in the right adnexal region, adjacent to the complex appearing cystic structures.   Electronically Signed   By: Rosalie Gums M.D.   On: 09/16/2013 12:29   09/16/2013   CLINICAL DATA:  Will reassess known right ovarian cystic mass.  EXAM: TRANSVAGINAL ULTRASOUND OF PELVIS  TECHNIQUE: Transvaginal ultrasound examination of the pelvis was performed including evaluation of the uterus, ovaries, adnexal regions, and pelvic cul-de-sac.  COMPARISON:  US PELVIS COMPLETE dated 08/03/2013  FINDINGS: Uterus  The uterus is surgically absent.  Right ovary  Measurements: 4.9 x 2.3 x 3.3 cm. There are 2 complex appearing cystic structures associated with the right ovary. One measures 2.7 x 1.8 x 2.5 cm. The second measures 2.5 x 2 x 2.2  cm. The fallopian tube on the right may be dilated and fluid-filled.  Left ovary  Measurements: 2.8 x 2.2 x 1.5 cm. There is a septated cystic structure measuring 10.7 x 5.0 5.9 cm.  Other findings:  No free fluid  IMPRESSION: 1. Since the previous study the dominant cystic structure associated with the right ovary has transitioned to 2 smaller separate appearing complex structures which likely reflect hemorrhagic cysts. The dimensions are as given above. The right fallopian tube is mildly dilated and fluid-filled. 2. On the left there has been interval development of a septated cystic structure without significant internal debris measuring as much as 10.7 cm in diameter.  Electronically Signed: By: David  [place] On: 08/31/2013 13:10    Ct Abdomen Pelvis W Contrast  09/16/2013   CLINICAL DATA:  Evaluate pelvic fluid collections. Patient complains of pelvic pain. Evaluate for percutaneous drainage.  EXAM: CT ABDOMEN AND PELVIS WITH CONTRAST  TECHNIQUE: Multidetector CT imaging of the abdomen and pelvis was performed using the standard protocol following bolus administration of intravenous contrast.  CONTRAST:  OMNIPAQUE IOHEXOL 300 MG/ML  SOLN  COMPARISON:  CT 07/30/2013 and ultrasound 09/15/2013  FINDINGS: The lung bases are clear.  No evidence for free air.  Normal appearance of the liver, gallbladder and portal venous system. Normal appearance of the spleen and pancreas and adrenal glands. Low-density structures in both kidneys are most compatible with cysts. No evidence for hydronephrosis. Incidentally, the patient has a circumaortic left renal vein.  There is a complex fluid collection or fluid collections in the pelvis. On the right side, a collection measures 6.1 x 3.1 cm. A collection near the mid pelvis measures 6.7 x 3.9 cm. There are additional fluid collections in the pelvis. Uterus is absent. Small low-density structure involving the left ovary. There may be a trace amount of free fluid.  Surgical clips near the cecum probably related to an appendectomy. Mild inflammatory changes in the lower abdomen and upper pelvic region near these pelvic fluid collections.  No acute bone abnormality.  IMPRESSION: Enlargement of the fluid collection(s) in the pelvis since 07/30/2013. Based on the recent ultrasound, these fluid collections may be associated with a hydrosalpinx. Findings concerning for an underlying infectious or inflammatory process.  These results were called by telephone at the time of interpretation on 09/16/2013 at 2:53 PM to Dr. Elsie Lincoln , who verbally acknowledged these results.   Electronically Signed   By: Richarda Overlie M.D.   On: 09/16/2013 15:00  [place] Art/ven Flow Abd Pelv Doppler  09/15/2013   CLINICAL DATA:  Abdominal pain. Elevated white blood cell count. Cystic lesions within the pelvis on prior ultrasound and CT.  EXAM: TRANSABDOMINAL AND TRANSVAGINAL ULTRASOUND OF PELVIS  DOPPLER ULTRASOUND OF OVARIES  TECHNIQUE: Both transabdominal and transvaginal ultrasound examinations of the pelvis were performed. Transabdominal technique was performed for global imaging of the pelvis including uterus, ovaries, adnexal regions, and pelvic cul-de-sac.  It was necessary to proceed with endovaginal exam following the transabdominal exam to visualize the ovaries . Color and duplex Doppler ultrasound was utilized to evaluate blood flow to the ovaries.  COMPARISON:  US TRANSVAGINAL NON-OB dated 08/31/2013  FINDINGS: Uterus  Measurements: Surgically absent.  There is a large complex tubular cystic structure within the mid pelvis extending to the right adnexa is increased in a complexity compared to prior with multiple internal septations. This has the appearance of an abscess. This tubular cystic lesions with internal septation measures 9.3 x 4.0 x 4.1 cm and is increased in complexity compared to prior.  There appears to be normal ovarian tissue within the right adnexa with normal arterial and  venous spectral waveforms. This normal portion of the ovary is adjacent to the complex septated cystic lesion and measures 3.3 x 2.2 cm.  The left ovary appears normal and is only identified transabdominally. Left ovary measures 2.1 x 5.1 x 2.7 cm  The right fallopian tube is enlarged.  IMPRESSION:  1. Increased in complexity of tubular cystic structure in the central pelvis and right adnexa with new internal septations is most concerning for tubo-ovarian abscess or hydro pyosalpinx. 2. Relatively normal right ovarian tissue is identified with flow so ovarian torsion is felt less likely. 3. The left ovary appears normal in size and with normal flow Findings conveyed toMARISSA SCIACCA on 09/15/2013  at01:45.   Electronically Signed   By: Genevive Bi M.D.   On: 09/15/2013 01:46   Ct Image Guided Drainage By Percutaneous Catheter  09/17/2013   CLINICAL DATA:  Pelvic pain, complex fluid collections by CT concerning for tubo-ovarian abscess  EXAM: CT-GUIDED RIGHT PELVIC FLUID COLLECTION NEEDLE ASPIRATION  Date:  3/24/20153/24/2015 1:20 PM  Radiologist:  Judie Petit. Ruel Favors, MD  Guidance:  CT  FLUOROSCOPY TIME:  None.  MEDICATIONS AND MEDICAL HISTORY: 4 mg Versed, 150 mcg fentanyl  ANESTHESIA/SEDATION: 10 min  CONTRAST:  None.  COMPLICATIONS: No immediate  PROCEDURE: Informed consent was obtained from the patient following explanation of the procedure, risks, benefits and alternatives. The patient understands, agrees and consents for the procedure. All questions were addressed. A time out was performed.  Maximal barrier sterile technique utilized including caps, mask, sterile gowns, sterile gloves, large sterile drape, hand hygiene, and Betadine.  Previous imaging reviewed. Patient positioned supine. Noncontrast localization CT performed through the pelvis. The complex pelvic fluid collection was localized. Under sterile conditions and local anesthesia, a 17 gauge 11.8 cm access needle was advanced from a right  anterior oblique approach into the right pelvic fluid collection. Needle position confirmed with CT. Syringe aspiration yielded 65 cc yellow serous sanguinous fluid. No purulent component. No signs of hematoma or definite abscess. Therefore the needle was removed. No immediate complication. Patient tolerated the procedure well. Sample sent for Gram stain culture.  IMPRESSION: Successful CT-guided right pelvic fluid collection needle aspiration.   Electronically Signed   By: Ruel Favors M.D.   On: 09/17/2013 14:45   Hospital Course:  Jill Wright is a 43 y.o. [phone]  admitted for scheduled abdominal pain.  Previously seen and evaluated for this same on numerous occasions, however, she had mildly elevated WBC and slightly worsening appearance on U/S and was admitted for presumed TOA.  She had no significant fevers, was started on Abx.  IR drainage occurred with CT guidance.  No purulent fluid noted.  Pt. Felt better on return after drainage. Elevated WBC had resolved. She is scheduled for Bilateral salpingo-oophorectomy and she desires to pursue this. It was felt pt. Had benefited maximally from hospitalization and was deemed stable for discharge.  Discharge Exam: Blood pressure 167/96, pulse 53, temperature 98.1 F (36.7 C), temperature source Oral, resp. rate 17, height 6\' 1"  (1.854 m), weight 229 lb 8 oz (104.101 kg), SpO2 94.00%. General appearance: alert and no distress  Resp: clear to auscultation bilaterally  Cardio: RRR  GI: soft, non-tender; bowel sounds normal; no masses, no organomegaly.   Extremities: extremities normal, atraumatic, no cyanosis or edema and Homans sign is negative, no sign of DVT  Discharged Condition: Stable  Disposition: 01-Home or Self Care  Discharge Orders   Future Orders Complete By Expires   Call MD for:  persistant nausea and vomiting  As directed    Call MD for:  severe uncontrolled pain  As directed    Call MD for:  temperature >100.4  As directed     Diet - low sodium heart healthy  As directed    Increase activity slowly  As directed        Medication List         albuterol 108 (90 BASE) MCG/ACT inhaler  Commonly known as:  PROVENTIL HFA;VENTOLIN HFA  Inhale 2 puffs into the lungs every 6 (six) hours as needed for wheezing or shortness of breath.     cloNIDine 0.2 MG tablet  Commonly known as:  CATAPRES  Take 0.2 mg by mouth 2 (two) times daily.     oxyCODONE-acetaminophen 5-325 MG per tablet  Commonly known as:  PERCOCET/ROXICET  Take 1-2 tablets by mouth every 6 (six) hours as needed.           Follow-up Information   Follow up with [facility] In 3 weeks. (As needed)    Specialty:  Obstetrics and Gynecology   Contact information:   [address] [place] [address] [phone]      Signed:  Reva Bores 09/18/2013 7:42 AM

## 2013-09-20 LAB — BODY FLUID CULTURE
Culture: NO GROWTH
Gram Stain: NONE SEEN

## 2013-09-22 LAB — ANAEROBIC CULTURE: Gram Stain: NONE SEEN

## 2013-09-23 ENCOUNTER — Telehealth: Payer: Self-pay | Admitting: *Deleted

## 2013-09-23 NOTE — Telephone Encounter (Signed)
Ultrasound scheduled for April 7 at 730 am. Patient agreeable to this.

## 2013-09-23 NOTE — Telephone Encounter (Signed)
Message copied by Mannie StabileASH, AMANDA A on Mon Sep 23, 2013  4:06 PM ------      Message from: Mayra NeerASSETTE, KELLY P      Created: Mon Sep 23, 2013  9:34 AM      Regarding: FW: Scheduled surgery       Please ensure this is scheduled and the patient is notified.      Thanks,      Tresa EndoKelly       ----- Message -----         From: [place] L Presnell         Sent: 09/19/2013   1:57 PM           To: Juliette MangleKelly Powell Rassette, RN, Reva Boresanya S Pratt, MD, #      Subject: Scheduled surgery                                        Posted on 10/25/13 @ 3:30p for [facility] BSO, poss laparotomy with Dr. Shawnie PonsPratt and H-Smith.........Marland Kitchen.            Tresa EndoKelly, Dr. Shawnie PonsPratt is asking for this patient to have an u/s 1 week prior to surgery.  Thanks                        ----- Message -----         From: Reva Boresanya S Pratt, MD         Sent: 09/09/2013   1:15 PM           To: [place] L Presnell            Pt. Needs laparoscopic BSO, possible laparotomy             ------

## 2013-10-10 ENCOUNTER — Encounter: Payer: Self-pay | Admitting: Obstetrics & Gynecology

## 2013-10-10 ENCOUNTER — Ambulatory Visit (INDEPENDENT_AMBULATORY_CARE_PROVIDER_SITE_OTHER): Payer: Self-pay | Admitting: Obstetrics & Gynecology

## 2013-10-10 VITALS — BP 163/107 | HR 69 | Temp 97.4°F | Ht 73.0 in | Wt 225.4 lb

## 2013-10-10 DIAGNOSIS — N7093 Salpingitis and oophoritis, unspecified: Secondary | ICD-10-CM

## 2013-10-10 MED ORDER — OXYCODONE-ACETAMINOPHEN 5-325 MG PO TABS: 1.0000 | ORAL_TABLET | Freq: Four times a day (QID) | ORAL | Status: DC | PRN

## 2013-10-10 NOTE — Patient Instructions (Signed)
Pelvic Pain, Female °Female pelvic pain can be caused by many different things and start from a variety of places. Pelvic pain refers to pain that is located in the lower half of the abdomen and between your hips. The pain may occur over a short period of time (acute) or may be reoccurring (chronic). The cause of pelvic pain may be related to disorders affecting the female reproductive organs (gynecologic), but it may also be related to the bladder, kidney stones, an intestinal complication, or muscle or skeletal problems. Getting help right away for pelvic pain is important, especially if there has been severe, sharp, or a sudden onset of unusual pain. It is also important to get help right away because some types of pelvic pain can be life threatening.  °CAUSES  °Below are only some of the causes of pelvic pain. The causes of pelvic pain can be in one of several categories.  °· Gynecologic. °· Pelvic inflammatory disease. °· Sexually transmitted infection. °· Ovarian cyst or a twisted ovarian ligament (ovarian torsion). °· Uterine lining that grows outside the uterus (endometriosis). °· Fibroids, cysts, or tumors. °· Ovulation. °· Pregnancy. °· Pregnancy that occurs outside the uterus (ectopic pregnancy). °· Miscarriage. °· Labor. °· Abruption of the placenta or ruptured uterus. °· Infection. °· Uterine infection (endometritis). °· Bladder infection. °· Diverticulitis. °· Miscarriage related to a uterine infection (septic abortion). °· Bladder. °· Inflammation of the bladder (cystitis). °· Kidney stone(s). °· Gastrointenstinal. °· Constipation. °· Diverticulitis. °· Neurologic. °· Trauma. °· Feeling pelvic pain because of mental or emotional causes (psychosomatic). °· Cancers of the bowel or pelvis. °EVALUATION  °Your caregiver will want to take a careful history of your concerns. This includes recent changes in your health, a careful gynecologic history of your periods (menses), and a sexual history. Obtaining  your family history and medical history is also important. Your caregiver may suggest a pelvic exam. A pelvic exam will help identify the location and severity of the pain. It also helps in the evaluation of which organ system may be involved. In order to identify the cause of the pelvic pain and be properly treated, your caregiver may order tests. These tests may include:  °· A pregnancy test. °· Pelvic ultrasonography. °· An X-ray exam of the abdomen. °· A urinalysis or evaluation of vaginal discharge. °· Blood tests. °HOME CARE INSTRUCTIONS  °· Only take over-the-counter or prescription medicines for pain, discomfort, or fever as directed by your caregiver.   °· Rest as directed by your caregiver.   °· Eat a balanced diet.   °· Drink enough fluids to make your urine clear or pale yellow, or as directed.   °· Avoid sexual intercourse if it causes pain.   °· Apply warm or cold compresses to the lower abdomen depending on which one helps the pain.   °· Avoid stressful situations.   °· Keep a journal of your pelvic pain. Write down when it started, where the pain is located, and if there are things that seem to be associated with the pain, such as food or your menstrual cycle. °· Follow up with your caregiver as directed.   °SEEK MEDICAL CARE IF: °· Your medicine does not help your pain. °· You have abnormal vaginal discharge. °SEEK IMMEDIATE MEDICAL CARE IF:  °· You have heavy bleeding from the vagina.   °· Your pelvic pain increases.   °· You feel lightheaded or faint.   °· You have chills.   °· You have pain with urination or blood in your urine.   °· You have uncontrolled   diarrhea or vomiting.   °· You have a fever or persistent symptoms for more than 3 days. °· You have a fever and your symptoms suddenly get worse.   °· You are being physically or sexually abused.   °MAKE SURE YOU: °· Understand these instructions. °· Will watch your condition. °· Will get help if you are not doing well or get worse. °Document  Released: 05/10/2004 Document Revised: 12/13/2011 Document Reviewed: 10/03/2011 °Exit[employer]® Patient Information ©2014 [employer]. ° °

## 2013-10-10 NOTE — Progress Notes (Signed)
Subjective:     Patient ID: Jill Wright, female   DOB: 04/22/1971, 43 y.o.   MRN: [id number]  [id number] No LMP recorded. Patient has had a hysterectomy. Scheduled for laparotomy after treatment for hydrosalpinx/abscessStill has pain and needs refill of pain med.   Review of Systems  Constitutional: Negative for fever.  Genitourinary: Positive for pelvic pain. Negative for dysuria, vaginal bleeding and vaginal discharge.       Objective:   Physical Exam  Constitutional: No distress.  Pulmonary/Chest: Effort normal. No respiratory distress.  Abdominal: She exhibits no distension. There is tenderness (mild).  Skin: Skin is warm and dry.  Psychiatric: She has a normal mood and affect. Her behavior is normal.       Assessment:     S/p pelvic absess.     Plan:     10/25/13 surgery scheduled Refill Percocet   Adam PhenixJames G Arnold, MD 10/10/2013

## 2013-10-14 ENCOUNTER — Encounter (HOSPITAL_COMMUNITY): Payer: Self-pay | Admitting: Pharmacist

## 2013-10-14 ENCOUNTER — Telehealth: Payer: Self-pay | Admitting: *Deleted

## 2013-10-14 NOTE — Telephone Encounter (Signed)
  Pt returned call and has questions concerning Dr. Shawnie PonsPratt opening her up for surgery instead of Laparoscopic.  Pt states she does not want anymore scars and states she is bleeding lightly.  Discussed with Dr. Maryla MorrowAnyanwu/Dr. Shawnie PonsPratt about patient's concerns.  Light bleeding is no concern at this point, patient needs to continue to monitor and notify is increase in color/amount.  Dr. Shawnie PonsPratt will start procedure with less invasive technique and will evaluate as surgery progresses.  Pt verbalizes understanding.

## 2013-10-14 NOTE — Telephone Encounter (Signed)
Pt called nurse line requesting information for surgery with Dr. Shawnie PonsPratt.  Attempted to call patient, no answer, left message for patient to return call to clinic.

## 2013-10-15 ENCOUNTER — Encounter (HOSPITAL_COMMUNITY): Payer: Self-pay | Admitting: *Deleted

## 2013-10-15 ENCOUNTER — Inpatient Hospital Stay (HOSPITAL_COMMUNITY): Payer: Self-pay

## 2013-10-15 ENCOUNTER — Inpatient Hospital Stay (HOSPITAL_COMMUNITY)
Admission: AD | Admit: 2013-10-15 | Discharge: 2013-10-15 | Disposition: A | Discharge status: Home / Self Care | Payer: Self-pay | Source: Ambulatory Visit | Attending: Obstetrics & Gynecology | Admitting: Obstetrics & Gynecology

## 2013-10-15 DIAGNOSIS — N7013 Chronic salpingitis and oophoritis: Secondary | ICD-10-CM | POA: Insufficient documentation

## 2013-10-15 DIAGNOSIS — N83209 Unspecified ovarian cyst, unspecified side: Secondary | ICD-10-CM | POA: Insufficient documentation

## 2013-10-15 DIAGNOSIS — N390 Urinary tract infection, site not specified: Secondary | ICD-10-CM

## 2013-10-15 DIAGNOSIS — N949 Unspecified condition associated with female genital organs and menstrual cycle: Secondary | ICD-10-CM | POA: Insufficient documentation

## 2013-10-15 LAB — CBC
HCT: 40.7 % (ref 36.0–46.0)
HEMOGLOBIN: 14.1 g/dL (ref 12.0–15.0)
MCH: 30.8 pg (ref 26.0–34.0)
MCHC: 34.6 g/dL (ref 30.0–36.0)
MCV: 88.9 fL (ref 78.0–100.0)
PLATELETS: 157 10*3/uL (ref 150–400)
RBC: 4.58 MIL/uL (ref 3.87–5.11)
RDW: 13.8 % (ref 11.5–15.5)
WBC: 13.5 10*3/uL — ABNORMAL HIGH (ref 4.0–10.5)

## 2013-10-15 LAB — COMPREHENSIVE METABOLIC PANEL
ALT: 10 U/L (ref 0–35)
AST: 11 U/L (ref 0–37)
Albumin: 3.7 g/dL (ref 3.5–5.2)
Alkaline Phosphatase: 55 U/L (ref 39–117)
BUN: 10 mg/dL (ref 6–23)
CALCIUM: 9.2 mg/dL (ref 8.4–10.5)
CO2: 28 mEq/L (ref 19–32)
Chloride: 98 mEq/L (ref 96–112)
Creatinine, Ser: 0.55 mg/dL (ref 0.50–1.10)
GFR calc non Af Amer: >90 mL/min (ref >90)
GLUCOSE: 103 mg/dL — AB (ref 70–99)
POTASSIUM: 3.5 meq/L — AB (ref 3.7–5.3)
Sodium: 138 mEq/L (ref 137–147)
TOTAL PROTEIN: 7 g/dL (ref 6.0–8.3)
Total Bilirubin: 0.3 mg/dL (ref 0.3–1.2)

## 2013-10-15 LAB — URINALYSIS, ROUTINE W REFLEX MICROSCOPIC
BILIRUBIN URINE: NEGATIVE
Glucose, UA: NEGATIVE mg/dL
KETONES UR: NEGATIVE mg/dL
Nitrite: NEGATIVE
PH: 6 (ref 5.0–8.0)
Protein, ur: 30 mg/dL — AB
Specific Gravity, Urine: 1.015 (ref 1.005–1.030)
Urobilinogen, UA: 0.2 mg/dL (ref 0.0–1.0)

## 2013-10-15 LAB — URINE MICROSCOPIC-ADD ON

## 2013-10-15 MED ORDER — NITROFURANTOIN MONOHYD MACRO 100 MG PO CAPS
100.0000 mg | ORAL_CAPSULE | Freq: Once | ORAL | Status: AC
Start: 2013-10-15 — End: 2013-10-15
  Administered 2013-10-15: 100 mg via ORAL
  Filled 2013-10-15: qty 1

## 2013-10-15 MED ORDER — NITROFURANTOIN MONOHYD MACRO 100 MG PO CAPS: 100.0000 mg | ORAL_CAPSULE | Freq: Two times a day (BID) | ORAL | Status: DC

## 2013-10-15 MED ORDER — PHENAZOPYRIDINE HCL 100 MG PO TABS
200.0000 mg | ORAL_TABLET | Freq: Once | ORAL | Status: AC
  Administered 2013-10-15: 200 mg via ORAL
  Filled 2013-10-15: qty 2

## 2013-10-15 MED ORDER — PHENAZOPYRIDINE HCL 200 MG PO TABS: 200.0000 mg | ORAL_TABLET | Freq: Three times a day (TID) | ORAL | Status: DC

## 2013-10-15 MED ORDER — OXYCODONE-ACETAMINOPHEN 5-325 MG PO TABS: 2.0000 | ORAL_TABLET | ORAL | Status: DC | PRN

## 2013-10-15 MED ORDER — OXYCODONE-ACETAMINOPHEN 5-325 MG PO TABS
2.0000 | ORAL_TABLET | Freq: Once | ORAL | Status: AC
  Administered 2013-10-15: 2 via ORAL
  Filled 2013-10-15: qty 2

## 2013-10-15 MED ORDER — PHENAZOPYRIDINE HCL 200 MG PO TABS: 200.0000 mg | ORAL_TABLET | Freq: Three times a day (TID) | ORAL | Status: DC | PRN

## 2013-10-15 NOTE — MAU Note (Signed)
Patient states she has a history of ovarian cysts. Has surgery scheduled for 5-1. States she has had increasing pain that got worse yesterday. States she has a little bleeding with urination.

## 2013-10-15 NOTE — MAU Provider Note (Signed)
History     CSN: [id number]  Arrival date and time: 10/15/13 1454   First Provider Initiated Contact with Patient 10/15/13 1554      Chief Complaint  Patient presents with  . Abdominal Pain   HPIpt is not pregnant and is scheduled for surgery 5-1.  Pt has been hospitalized for TOA And ovarian cyst.  Pt has been taking Percocet with relief of pain until Sunday and progressively gotten worse. Pt is having some light bleeding that she notices when she urinates.  Pt is having a little bit of pain with urination with frequency of urination And incomplete emptying - voiding small amounts.    Rn note: Pt states pain is an on going, problem that she has been hospitalized for. Pt states she is scheduled for surgery. Pt says pain is the same. Pt states "they drew some fluid out of me when i was here before".     Telephone call: Candelaria Stagersandace Haizlip, RN Registered Nurse Signed  Telephone Encounter Service date: 10/14/2013 11:33 AM  [facility]   Pt returned call and has questions concerning Dr. Shawnie PonsPratt opening her up for surgery instead of Laparoscopic. Pt states she does not want anymore scars and states she is bleeding lightly. Discussed with Dr. Maryla MorrowAnyanwu/Dr. Shawnie PonsPratt about patient's concerns. Light bleeding is no concern at this point, patient needs to continue to monitor and notify is increase in color/amount. Dr. Shawnie PonsPratt will start procedure with less invasive technique and will evaluate as surgery progresses. Pt verbalizes understanding.     Past Medical History  Diagnosis Date  . Heart murmur   . Asthma   . Hypertension   . Anxiety     Past Surgical History  Procedure Laterality Date  . Appendectomy    . Cesarean section    . Abdominal hysterectomy      Family History  Problem Relation Age of Onset  . Cancer Mother 1948    breast  . Hypertension Mother   . Heart disease Father   . Stroke Father   . Arthritis Daughter   .  Seizures Son   . Hypertension Maternal Grandmother   . Arthritis Daughter     History  Substance Use Topics  . Smoking status: Current Every Day Smoker -- 0.50 packs/day    Types: Cigarettes  . Smokeless tobacco: Never Used  . Alcohol Use: No    Allergies:  Allergies  Allergen Reactions  . Lisinopril Anaphylaxis  . Asa [Aspirin] Hives and Swelling  . Ultram [Tramadol] Nausea And Vomiting    Prescriptions prior to admission  Medication Sig Dispense Refill  . atenolol-chlorthalidone (TENORETIC) 100-25 MG per tablet Take 1 tablet by mouth daily.      Marland Kitchen. albuterol (PROVENTIL HFA;VENTOLIN HFA) 108 (90 BASE) MCG/ACT inhaler Inhale 2 puffs into the lungs every 6 (six) hours as needed for wheezing or shortness of breath.      . metoprolol-hydrochlorothiazide (LOPRESSOR HCT) 100-25 MG per tablet Take 1 tablet by mouth daily.      Marland Kitchen. oxyCODONE-acetaminophen (PERCOCET/ROXICET) 5-325 MG per tablet Take 2 tablets by mouth every 6 (six) hours as needed for moderate pain.        Review of Systems  Constitutional: Positive for weight loss. Negative for fever and chills.  Eyes: Negative for blurred vision.  Cardiovascular: Negative for chest pain.  Gastrointestinal: Positive for abdominal pain. Negative for diarrhea and constipation.       Decreased appetite  Genitourinary: Positive for  dysuria, urgency, frequency and hematuria.  Musculoskeletal: Positive for back pain.  Neurological: Positive for headaches. Negative for dizziness.   Physical Exam   Blood pressure 128/84, pulse 70, temperature 98.5 F (36.9 C), temperature source Oral, resp. rate 20, height 5\' 11"  (1.803 m), weight 100.88 kg (222 lb 6.4 oz), SpO2 99.00%.  Physical Exam  Constitutional: She is oriented to person, place, and time. She appears well-developed and well-nourished. No distress.  HENT:  Head: Normocephalic.  Eyes: Pupils are equal, round, and reactive to light.  Neck: Normal range of motion.  Cardiovascular:  Normal rate.   Respiratory: Effort normal.  Neg flank pain  GI: Soft. She exhibits no distension. There is tenderness. There is no rebound.  Musculoskeletal: Normal range of motion.  Neurological: She is alert and oriented to person, place, and time.  Skin: Skin is warm and dry.  Psychiatric: She has a normal mood and affect.    MAU Course  Procedures Results for orders placed during the hospital encounter of 10/15/13 (from the past 24 hour(s))  URINALYSIS, ROUTINE W REFLEX MICROSCOPIC     Status: Abnormal   Collection Time    10/15/13  3:35 PM      Result Value Ref Range   Color, Urine YELLOW  YELLOW   APPearance HAZY (*) CLEAR   Specific Gravity, Urine 1.015  1.005 - 1.030   pH 6.0  5.0 - 8.0   Glucose, UA NEGATIVE  NEGATIVE mg/dL   Hgb urine dipstick LARGE (*) NEGATIVE   Bilirubin Urine NEGATIVE  NEGATIVE   Ketones, ur NEGATIVE  NEGATIVE mg/dL   Protein, ur 30 (*) NEGATIVE mg/dL   Urobilinogen, UA 0.2  0.0 - 1.0 mg/dL   Nitrite NEGATIVE  NEGATIVE   Leukocytes, UA MODERATE (*) NEGATIVE  URINE MICROSCOPIC-ADD ON     Status: Abnormal   Collection Time    10/15/13  3:35 PM      Result Value Ref Range   Squamous Epithelial / LPF FEW (*) RARE   WBC, UA 21-50  <3 WBC/hpf   RBC / HPF 21-50  <3 RBC/hpf   Bacteria, UA FEW (*) RARE  CBC     Status: Abnormal   Collection Time    10/15/13  4:34 PM      Result Value Ref Range   WBC 13.5 (*) 4.0 - 10.5 K/uL   RBC 4.58  3.87 - 5.11 MIL/uL   Hemoglobin 14.1  12.0 - 15.0 g/dL   HCT 16.1  09.6 - 04.5 %   MCV 88.9  78.0 - 100.0 fL   MCH 30.8  26.0 - 34.0 pg   MCHC 34.6  30.0 - 36.0 g/dL   RDW 40.9  81.1 - 91.4 %   Platelets 157  150 - 400 K/uL  COMPREHENSIVE METABOLIC PANEL     Status: Abnormal   Collection Time    10/15/13  4:34 PM      Result Value Ref Range   Sodium 138  137 - 147 mEq/L   Potassium 3.5 (*) 3.7 - 5.3 mEq/L   Chloride 98  96 - 112 mEq/L   CO2 28  19 - 32 mEq/L   Glucose, Bld 103 (*) 70 - 99 mg/dL   BUN 10   6 - 23 mg/dL   Creatinine, Ser 7.82  0.50 - 1.10 mg/dL   Calcium 9.2  8.4 - 95.6 mg/dL   Total Protein 7.0  6.0 - 8.3 g/dL   Albumin 3.7  3.5 - 5.2  g/dL   AST 11  0 - 37 U/L   ALT 10  0 - 35 U/L   Alkaline Phosphatase 55  39 - 117 U/L   Total Bilirubin 0.3  0.3 - 1.2 mg/dL   GFR calc non Af Amer >90  >90 mL/min   GFR calc Af Amer >90  >90 mL/min  pt felt much better after macrobid and another dose of Percocet and macrobid [place] Transvaginal Non-ob  10/15/2013   CLINICAL DATA:  Pelvic pain. Ovarian cyst. History of hydro/pyosalpinx.  EXAM: TRANSVAGINAL ULTRASOUND OF PELVIS  DOPPLER ULTRASOUND OF OVARIES  TECHNIQUE: Transvaginal ultrasound examination of the pelvis was performed including evaluation of the uterus, ovaries, adnexal regions, and pelvic cul-de-sac.  Color and duplex Doppler ultrasound was utilized to evaluate blood flow to the ovaries.  COMPARISON:  Pelvic ultrasound 09/15/2013  FINDINGS: Uterus  Surgically absent.  Right ovary  Measurements: 3.2 x 2.2 x 2.1 cm. The right ovary is normal in appearance. The complex cystic structure in the right adnexal on the prior ultrasound is no longer seen. The fallopian tube is again seen to be fluid filled and dilated.  Left ovary  Measurements: 6.0 x 3.5 x 2.8 cm. There is a left ovarian cyst with scattered internal echoes measuring 2.2 x 1.8 x 1.6 cm.  Pulsed Doppler evaluation demonstrates normal low-resistance arterial and venous waveforms in both ovaries.  IMPRESSION: 1. Interval resolution of complex cystic structure in the right adnexa. Unremarkable appearance of the right ovary. 2. Right hydrosalpinx. 3. 2.2 cm left ovarian cyst with scattered internal debris. 4. No evidence of ovarian torsion during this examination.   Electronically Signed   By: Sebastian AcheAllen  Grady   On: 10/15/2013 17:30   [place] Art/ven Flow Abd Pelv Doppler  10/15/2013   CLINICAL DATA:  Pelvic pain. Ovarian cyst. History of hydro/pyosalpinx.  EXAM: TRANSVAGINAL ULTRASOUND OF PELVIS   DOPPLER ULTRASOUND OF OVARIES  TECHNIQUE: Transvaginal ultrasound examination of the pelvis was performed including evaluation of the uterus, ovaries, adnexal regions, and pelvic cul-de-sac.  Color and duplex Doppler ultrasound was utilized to evaluate blood flow to the ovaries.  COMPARISON:  Pelvic ultrasound 09/15/2013  FINDINGS: Uterus  Surgically absent.  Right ovary  Measurements: 3.2 x 2.2 x 2.1 cm. The right ovary is normal in appearance. The complex cystic structure in the right adnexal on the prior ultrasound is no longer seen. The fallopian tube is again seen to be fluid filled and dilated.  Left ovary  Measurements: 6.0 x 3.5 x 2.8 cm. There is a left ovarian cyst with scattered internal echoes measuring 2.2 x 1.8 x 1.6 cm.  Pulsed Doppler evaluation demonstrates normal low-resistance arterial and venous waveforms in both ovaries.  IMPRESSION: 1. Interval resolution of complex cystic structure in the right adnexa. Unremarkable appearance of the right ovary. 2. Right hydrosalpinx. 3. 2.2 cm left ovarian cyst with scattered internal debris. 4. No evidence of ovarian torsion during this examination.   Electronically Signed   By: Sebastian AcheAllen  Grady   On: 10/15/2013 17:30  discussed with Dr. Shawnie PonsPratt  Assessment and Plan  UTI- Macrobid x 7 days- 1st dose given in MAU Pyridium 200mg  TID prn pain with urination Percocet RX F/u with GYN clinic as advised  Jean RosenthalSusan P Azya Barbero 10/15/2013, 3:55 PM

## 2013-10-15 NOTE — MAU Note (Signed)
Pt states pain is an on going, problem that she has been hospitalized for. Pt states she is scheduled for surgery. Pt says pain is the same. Pt states "they drew some fluid out of me when i was here before".

## 2013-10-16 NOTE — H&P (Signed)
Jill Wright is an 43 y.o. [id number]  female.   Chief Complaint: Pelvic masses and pelvic pain HPI: Patient has h/o ovarian cysts x 2 and previous hysterectomy.  Possible TOA's, and probable hydrosalpinges, who desires definitive treatment.  Past Medical History  Diagnosis Date  . Heart murmur   . Asthma   . Hypertension   . Anxiety     Past Surgical History  Procedure Laterality Date  . Appendectomy    . Cesarean section    . Abdominal hysterectomy      Family History  Problem Relation Age of Onset  . Cancer Mother [id number]    breast  . Hypertension Mother   . Heart disease Father   . Stroke Father   . Arthritis Daughter   . Seizures Son   . Hypertension Maternal Grandmother   . Arthritis Daughter    Social History:  reports that she has been smoking Cigarettes.  She has been smoking about 0.50 packs per day. She has never used smokeless tobacco. She reports that she does not drink alcohol or use illicit drugs.  Allergies:  Allergies  Allergen Reactions  . Lisinopril Anaphylaxis  . Asa [Aspirin] Hives and Swelling  . Ultram [Tramadol] Nausea And Vomiting    No current facility-administered medications on file prior to encounter.   Current Outpatient Prescriptions on File Prior to Encounter  Medication Sig Dispense Refill  . albuterol (PROVENTIL HFA;VENTOLIN HFA) 108 (90 BASE) MCG/ACT inhaler Inhale 2 puffs into the lungs every 6 (six) hours as needed for wheezing or shortness of breath.        Pertinent items are noted in HPI.  Physical Exam: General appearance: alert, cooperative and appears stated age Head: Normocephalic, without obvious abnormality, atraumatic Neck: supple, symmetrical, trachea midline Lungs: clear to auscultation bilaterally Heart: regular rate and rhythm Abdomen: soft, non-tender; bowel sounds normal; no masses,  no organomegaly Extremities: extremities normal, atraumatic, no cyanosis or edema   Lab Results  Component Value Date    WBC 13.5* 10/15/2013   HGB 14.1 10/15/2013   HCT 40.7 10/15/2013   MCV 88.9 10/15/2013   PLT 157 10/15/2013   Lab Results  Component Value Date   PREGTESTUR NEGATIVE 09/14/2013   [place] Transvaginal Non-ob  10/15/2013   CLINICAL DATA:  Pelvic pain. Ovarian cyst. History of hydro/pyosalpinx.  EXAM: TRANSVAGINAL ULTRASOUND OF PELVIS  DOPPLER ULTRASOUND OF OVARIES  TECHNIQUE: Transvaginal ultrasound examination of the pelvis was performed including evaluation of the uterus, ovaries, adnexal regions, and pelvic cul-de-sac.  Color and duplex Doppler ultrasound was utilized to evaluate blood flow to the ovaries.  COMPARISON:  Pelvic ultrasound 09/15/2013  FINDINGS: Uterus  Surgically absent.  Right ovary  Measurements: 3.2 x 2.2 x 2.1 cm. The right ovary is normal in appearance. The complex cystic structure in the right adnexal on the prior ultrasound is no longer seen. The fallopian tube is again seen to be fluid filled and dilated.  Left ovary  Measurements: 6.0 x 3.5 x 2.8 cm. There is a left ovarian cyst with scattered internal echoes measuring 2.2 x 1.8 x 1.6 cm.  Pulsed Doppler evaluation demonstrates normal low-resistance arterial and venous waveforms in both ovaries.  IMPRESSION: 1. Interval resolution of complex cystic structure in the right adnexa. Unremarkable appearance of the right ovary. 2. Right hydrosalpinx. 3. 2.2 cm left ovarian cyst with scattered internal debris. 4. No evidence of ovarian torsion during this examination.   Electronically Signed   By: Sebastian AcheAllen  Grady   On:  10/15/2013 17:30     Ct Image Guided Drainage By Percutaneous Catheter  09/17/2013   CLINICAL DATA:  Pelvic pain, complex fluid collections by CT concerning for tubo-ovarian abscess  EXAM: CT-GUIDED RIGHT PELVIC FLUID COLLECTION NEEDLE ASPIRATION  Date:  3/24/20153/24/2015 1:20 PM  Radiologist:  Judie PetitM. Ruel Favorsrevor Shick, MD  Guidance:  CT  FLUOROSCOPY TIME:  None.  MEDICATIONS AND MEDICAL HISTORY: 4 mg Versed, 150 mcg fentanyl   ANESTHESIA/SEDATION: 10 min  CONTRAST:  None.  COMPLICATIONS: No immediate  PROCEDURE: Informed consent was obtained from the patient following explanation of the procedure, risks, benefits and alternatives. The patient understands, agrees and consents for the procedure. All questions were addressed. A time out was performed.  Maximal barrier sterile technique utilized including caps, mask, sterile gowns, sterile gloves, large sterile drape, hand hygiene, and Betadine.  Previous imaging reviewed. Patient positioned supine. Noncontrast localization CT performed through the pelvis. The complex pelvic fluid collection was localized. Under sterile conditions and local anesthesia, a 17 gauge 11.8 cm access needle was advanced from a right anterior oblique approach into the right pelvic fluid collection. Needle position confirmed with CT. Syringe aspiration yielded 65 cc yellow serous sanguinous fluid. No purulent component. No signs of hematoma or definite abscess. Therefore the needle was removed. No immediate complication. Patient tolerated the procedure well. Sample sent for Gram stain culture.  IMPRESSION: Successful CT-guided right pelvic fluid collection needle aspiration.   Electronically Signed   By: Ruel Favorsrevor  Shick M.D.   On: 09/17/2013 14:45    Assessment/Plan Patient Active Problem List   Diagnosis Date Noted  . Hydrosalpinx 09/18/2013  . Pyosalpinx 09/16/2013  . TOA (tubo-ovarian abscess) 09/15/2013  . Tobacco use 09/09/2013  . Heart murmur 09/09/2013  . Asthma, mild intermittent 09/09/2013  . Essential hypertension, benign 09/09/2013  . Ovarian cyst 08/29/2013   Laparoscopic removal of adnexal masses, salpingectomy, possible oophorectomy, possible laparotomy. Risks include but are not limited to bleeding, infection, injury to surrounding structures, including bowel, bladder and ureters, blood clots, and death.  Likelihood of success is moderate.   Reva Boresanya S Pratt 10/16/2013, 4:24 AM

## 2013-10-17 LAB — URINE CULTURE

## 2013-10-17 NOTE — MAU Provider Note (Signed)

## 2013-10-18 ENCOUNTER — Ambulatory Visit (HOSPITAL_COMMUNITY): Payer: Self-pay

## 2013-10-18 ENCOUNTER — Emergency Department (HOSPITAL_COMMUNITY)
Admission: EM | Admit: 2013-10-18 | Discharge: 2013-10-18 | Disposition: A | Discharge status: Home / Self Care | Payer: Self-pay | Attending: Emergency Medicine | Admitting: Emergency Medicine

## 2013-10-18 ENCOUNTER — Encounter (HOSPITAL_COMMUNITY): Payer: Self-pay | Admitting: Emergency Medicine

## 2013-10-18 DIAGNOSIS — R011 Cardiac murmur, unspecified: Secondary | ICD-10-CM | POA: Insufficient documentation

## 2013-10-18 DIAGNOSIS — Z79899 Other long term (current) drug therapy: Secondary | ICD-10-CM | POA: Insufficient documentation

## 2013-10-18 DIAGNOSIS — I10 Essential (primary) hypertension: Secondary | ICD-10-CM | POA: Insufficient documentation

## 2013-10-18 DIAGNOSIS — N390 Urinary tract infection, site not specified: Secondary | ICD-10-CM | POA: Insufficient documentation

## 2013-10-18 DIAGNOSIS — Z3202 Encounter for pregnancy test, result negative: Secondary | ICD-10-CM | POA: Insufficient documentation

## 2013-10-18 DIAGNOSIS — Z8659 Personal history of other mental and behavioral disorders: Secondary | ICD-10-CM | POA: Insufficient documentation

## 2013-10-18 DIAGNOSIS — F17200 Nicotine dependence, unspecified, uncomplicated: Secondary | ICD-10-CM | POA: Insufficient documentation

## 2013-10-18 DIAGNOSIS — A599 Trichomoniasis, unspecified: Secondary | ICD-10-CM

## 2013-10-18 DIAGNOSIS — Z8742 Personal history of other diseases of the female genital tract: Secondary | ICD-10-CM | POA: Insufficient documentation

## 2013-10-18 DIAGNOSIS — G8929 Other chronic pain: Secondary | ICD-10-CM | POA: Insufficient documentation

## 2013-10-18 DIAGNOSIS — J45909 Unspecified asthma, uncomplicated: Secondary | ICD-10-CM | POA: Insufficient documentation

## 2013-10-18 DIAGNOSIS — R109 Unspecified abdominal pain: Secondary | ICD-10-CM | POA: Insufficient documentation

## 2013-10-18 DIAGNOSIS — A5901 Trichomonal vulvovaginitis: Secondary | ICD-10-CM | POA: Insufficient documentation

## 2013-10-18 HISTORY — DX: Unspecified ovarian cyst, unspecified side: N83.209

## 2013-10-18 LAB — COMPREHENSIVE METABOLIC PANEL
ALT: 18 U/L (ref 0–35)
AST: 23 U/L (ref 0–37)
Albumin: 3.7 g/dL (ref 3.5–5.2)
Alkaline Phosphatase: 71 U/L (ref 39–117)
BILIRUBIN TOTAL: 0.2 mg/dL — AB (ref 0.3–1.2)
BUN: 14 mg/dL (ref 6–23)
CHLORIDE: 99 meq/L (ref 96–112)
CO2: 25 mEq/L (ref 19–32)
CREATININE: 0.62 mg/dL (ref 0.50–1.10)
Calcium: 9.8 mg/dL (ref 8.4–10.5)
GFR calc Af Amer: >90 mL/min (ref >90)
GFR calc non Af Amer: >90 mL/min (ref >90)
Glucose, Bld: 106 mg/dL — ABNORMAL HIGH (ref 70–99)
POTASSIUM: 3.9 meq/L (ref 3.7–5.3)
Sodium: 137 mEq/L (ref 137–147)
Total Protein: 7.2 g/dL (ref 6.0–8.3)

## 2013-10-18 LAB — URINE MICROSCOPIC-ADD ON

## 2013-10-18 LAB — CBC WITH DIFFERENTIAL/PLATELET
BASOS ABS: 0.1 10*3/uL (ref 0.0–0.1)
Basophils Relative: 1 % (ref 0–1)
Eosinophils Absolute: 0.2 10*3/uL (ref 0.0–0.7)
Eosinophils Relative: 2 % (ref 0–5)
HCT: 41.3 % (ref 36.0–46.0)
Hemoglobin: 13.9 g/dL (ref 12.0–15.0)
Lymphocytes Relative: 39 % (ref 12–46)
Lymphs Abs: 4.2 10*3/uL — ABNORMAL HIGH (ref 0.7–4.0)
MCH: 29.5 pg (ref 26.0–34.0)
MCHC: 33.7 g/dL (ref 30.0–36.0)
MCV: 87.7 fL (ref 78.0–100.0)
MONO ABS: 0.6 10*3/uL (ref 0.1–1.0)
Monocytes Relative: 5 % (ref 3–12)
NEUTROS PCT: 53 % (ref 43–77)
Neutro Abs: 5.9 10*3/uL (ref 1.7–7.7)
Platelets: 208 10*3/uL (ref 150–400)
RBC: 4.71 MIL/uL (ref 3.87–5.11)
RDW: 13.4 % (ref 11.5–15.5)
WBC: 11 10*3/uL — ABNORMAL HIGH (ref 4.0–10.5)

## 2013-10-18 LAB — URINALYSIS, ROUTINE W REFLEX MICROSCOPIC
Bilirubin Urine: NEGATIVE
Glucose, UA: NEGATIVE mg/dL
Ketones, ur: NEGATIVE mg/dL
Nitrite: NEGATIVE
PROTEIN: 30 mg/dL — AB
Specific Gravity, Urine: 1.015 (ref 1.005–1.030)
Urobilinogen, UA: 0.2 mg/dL (ref 0.0–1.0)
pH: 6 (ref 5.0–8.0)

## 2013-10-18 LAB — WET PREP, GENITAL

## 2013-10-18 LAB — I-STAT CG4 LACTIC ACID, ED: LACTIC ACID, VENOUS: 0.89 mmol/L (ref 0.5–2.2)

## 2013-10-18 LAB — POC URINE PREG, ED: Preg Test, Ur: NEGATIVE

## 2013-10-18 MED ORDER — AZITHROMYCIN 250 MG PO TABS
1000.0000 mg | ORAL_TABLET | Freq: Once | ORAL | Status: AC
  Administered 2013-10-18: 1000 mg via ORAL
  Filled 2013-10-18: qty 4

## 2013-10-18 MED ORDER — ONDANSETRON HCL 4 MG/2ML IJ SOLN
4.0000 mg | Freq: Once | INTRAMUSCULAR | Status: AC
  Administered 2013-10-18: 4 mg via INTRAVENOUS
  Filled 2013-10-18: qty 2

## 2013-10-18 MED ORDER — MORPHINE SULFATE 4 MG/ML IJ SOLN
4.0000 mg | Freq: Once | INTRAMUSCULAR | Status: AC
  Administered 2013-10-18: 4 mg via INTRAVENOUS
  Filled 2013-10-18: qty 1

## 2013-10-18 MED ORDER — CEFTRIAXONE SODIUM 250 MG IJ SOLR
250.0000 mg | Freq: Once | INTRAMUSCULAR | Status: AC
  Administered 2013-10-18: 250 mg via INTRAMUSCULAR
  Filled 2013-10-18: qty 250

## 2013-10-18 MED ORDER — CIPROFLOXACIN HCL 500 MG PO TABS: 500.0000 mg | ORAL_TABLET | Freq: Two times a day (BID) | ORAL | Status: DC

## 2013-10-18 MED ORDER — METRONIDAZOLE 500 MG PO TABS
2000.0000 mg | ORAL_TABLET | Freq: Once | ORAL | Status: AC
  Administered 2013-10-18: 2000 mg via ORAL
  Filled 2013-10-18: qty 4

## 2013-10-18 MED ORDER — OXYCODONE-ACETAMINOPHEN 5-325 MG PO TABS: 1.0000 | ORAL_TABLET | Freq: Four times a day (QID) | ORAL | Status: DC | PRN

## 2013-10-18 NOTE — Discharge Instructions (Signed)
Abdominal Pain, Women °Abdominal (stomach, pelvic, or belly) pain can be caused by many things. It is important to tell your doctor: °· The location of the pain. °· Does it come and go or is it present all the time? °· Are there things that start the pain (eating certain foods, exercise)? °· Are there other symptoms associated with the pain (fever, nausea, vomiting, diarrhea)? °All of this is helpful to know when trying to find the cause of the pain. °CAUSES  °· Stomach: virus or bacteria infection, or ulcer. °· Intestine: appendicitis (inflamed appendix), regional ileitis (Crohn's disease), ulcerative colitis (inflamed colon), irritable bowel syndrome, diverticulitis (inflamed diverticulum of the colon), or cancer of the stomach or intestine. °· Gallbladder disease or stones in the gallbladder. °· Kidney disease, kidney stones, or infection. °· Pancreas infection or cancer. °· Fibromyalgia (pain disorder). °· Diseases of the female organs: °· Uterus: fibroid (non-cancerous) tumors or infection. °· Fallopian tubes: infection or tubal pregnancy. °· Ovary: cysts or tumors. °· Pelvic adhesions (scar tissue). °· Endometriosis (uterus lining tissue growing in the pelvis and on the pelvic organs). °· Pelvic congestion syndrome (female organs filling up with blood just before the menstrual period). °· Pain with the menstrual period. °· Pain with ovulation (producing an egg). °· Pain with an IUD (intrauterine device, birth control) in the uterus. °· Cancer of the female organs. °· Functional pain (pain not caused by a disease, may improve without treatment). °· Psychological pain. °· Depression. °DIAGNOSIS  °Your doctor will decide the seriousness of your pain by doing an examination. °· Blood tests. °· X-rays. °· Ultrasound. °· CT scan (computed tomography, special type of X-ray). °· MRI (magnetic resonance imaging). °· Cultures, for infection. °· Barium enema (dye inserted in the large intestine, to better view it with  X-rays). °· Colonoscopy (looking in intestine with a lighted tube). °· Laparoscopy (minor surgery, looking in abdomen with a lighted tube). °· Major abdominal exploratory surgery (looking in abdomen with a large incision). °TREATMENT  °The treatment will depend on the cause of the pain.  °· Many cases can be observed and treated at home. °· Over-the-counter medicines recommended by your caregiver. °· Prescription medicine. °· Antibiotics, for infection. °· Birth control pills, for painful periods or for ovulation pain. °· Hormone treatment, for endometriosis. °· Nerve blocking injections. °· Physical therapy. °· Antidepressants. °· Counseling with a psychologist or psychiatrist. °· Minor or major surgery. °HOME CARE INSTRUCTIONS  °· Do not take laxatives, unless directed by your caregiver. °· Take over-the-counter pain medicine only if ordered by your caregiver. Do not take aspirin because it can cause an upset stomach or bleeding. °· Try a clear liquid diet (broth or water) as ordered by your caregiver. Slowly move to a bland diet, as tolerated, if the pain is related to the stomach or intestine. °· Have a thermometer and take your temperature several times a day, and record it. °· Bed rest and sleep, if it helps the pain. °· Avoid sexual intercourse, if it causes pain. °· Avoid stressful situations. °· Keep your follow-up appointments and tests, as your caregiver orders. °· If the pain does not go away with medicine or surgery, you may try: °· Acupuncture. °· Relaxation exercises (yoga, meditation). °· Group therapy. °· Counseling. °SEEK MEDICAL CARE IF:  °· You notice certain foods cause stomach pain. °· Your home care treatment is not helping your pain. °· You need stronger pain medicine. °· You want your IUD removed. °· You feel faint or   lightheaded.  You develop nausea and vomiting.  You develop a rash.  You are having side effects or an allergy to your medicine. SEEK IMMEDIATE MEDICAL CARE IF:   Your  pain does not go away or gets worse.  You have a fever.  Your pain is felt only in portions of the abdomen. The right side could possibly be appendicitis. The left lower portion of the abdomen could be colitis or diverticulitis.  You are passing blood in your stools (bright red or black tarry stools, with or without vomiting).  You have blood in your urine.  You develop chills, with or without a fever.  You pass out. MAKE SURE YOU:   Understand these instructions.  Will watch your condition.  Will get help right away if you are not doing well or get worse. Document Released: 04/10/2007 Document Revised: 09/05/2011 Document Reviewed: 04/30/2009 [facility] Patient Information 2014 [place], [place]. Trichomoniasis Trichomoniasis is an infection, caused by the Trichomonas organism, that affects both women and men. In women, the outer female genitalia and the vagina are affected. In men, the penis is mainly affected, but the prostate and other reproductive organs can also be involved. Trichomoniasis is a sexually transmitted disease (STD) and is most often passed to another person through sexual contact. The majority of people who get trichomoniasis do so from a sexual encounter and are also at risk for other STDs. CAUSES   Sexual intercourse with an infected partner.  It can be present in swimming pools or hot tubs. SYMPTOMS   Abnormal gray-green frothy vaginal discharge in women.  Vaginal itching and irritation in women.  Itching and irritation of the area outside the vagina in women.  Penile discharge with or without pain in males.  Inflammation of the urethra (urethritis), causing painful urination.  Bleeding after sexual intercourse. RELATED COMPLICATIONS  Pelvic inflammatory disease.  Infection of the uterus (endometritis).  Infertility.  Tubal (ectopic) pregnancy.  It can be associated with other STDs, including gonorrhea and chlamydia, hepatitis B, and  HIV. COMPLICATIONS DURING PREGNANCY  Early (premature) delivery.  Premature rupture of the membranes (PROM).  Low birth weight. DIAGNOSIS   Visualization of Trichomonas under the microscope from the vagina discharge.  Ph of the vagina greater than 4.5, tested with a test tape.  Trich Rapid Test.  Culture of the organism, but this is not usually needed.  It may be found on a Pap test.  Having a "strawberry cervix,"which means the cervix looks very red like a strawberry. TREATMENT   You may be given medication to fight the infection. Inform your caregiver if you could be or are pregnant. Some medications used to treat the infection should not be taken during pregnancy.  Over-the-counter medications or creams to decrease itching or irritation may be recommended.  Your sexual partner will need to be treated if infected. HOME CARE INSTRUCTIONS   Take all medication prescribed by your caregiver.  Take over-the-counter medication for itching or irritation as directed by your caregiver.  Do not have sexual intercourse while you have the infection.  Do not douche or wear tampons.  Discuss your infection with your partner, as your partner may have acquired the infection from you. Or, your partner may have been the person who transmitted the infection to you.  Have your sex partner examined and treated if necessary.  Practice safe, informed, and protected sex.  See your caregiver for other STD testing. SEEK MEDICAL CARE IF:   You still have symptoms after you  finish the medication.  You have an oral temperature above 102 F (38.9 C).  You develop belly (abdominal) pain.  You have pain when you urinate.  You have bleeding after sexual intercourse.  You develop a rash.  The medication makes you sick or makes you throw up (vomit). Document Released: 12/07/2000 Document Revised: 09/05/2011 Document Reviewed: 01/02/2009 [facility] Patient Information 2014 [place]. Urinary Tract Infection Urinary tract infections (UTIs) can develop anywhere along your urinary tract. Your urinary tract is your body's drainage system for removing wastes and extra water. Your urinary tract includes two kidneys, two ureters, a bladder, and a urethra. Your kidneys are a pair of bean-shaped organs. Each kidney is about the size of your fist. They are located below your ribs, one on each side of your spine. CAUSES Infections are caused by microbes, which are microscopic organisms, including fungi, viruses, and bacteria. These organisms are so small that they can only be seen through a microscope. Bacteria are the microbes that most commonly cause UTIs. SYMPTOMS  Symptoms of UTIs may vary by age and gender of the patient and by the location of the infection. Symptoms in young women typically include a frequent and intense urge to urinate and a painful, burning feeling in the bladder or urethra during urination. Older women and men are more likely to be tired, shaky, and weak and have muscle aches and abdominal pain. A fever may mean the infection is in your kidneys. Other symptoms of a kidney infection include pain in your back or sides below the ribs, nausea, and vomiting. DIAGNOSIS To diagnose a UTI, your caregiver will ask you about your symptoms. Your caregiver also will ask to provide a urine sample. The urine sample will be tested for bacteria and white blood cells. White blood cells are made by your body to help fight infection. TREATMENT  Typically, UTIs can be treated with medication. Because most UTIs are caused by a bacterial infection, they usually can be treated with the use of antibiotics. The choice of antibiotic and length of treatment depend on your symptoms and the type of bacteria causing your infection. HOME CARE INSTRUCTIONS  If you were prescribed antibiotics, take them exactly as your caregiver instructs you. Finish the medication even if you feel better after  you have only taken some of the medication.  Drink enough water and fluids to keep your urine clear or pale yellow.  Avoid caffeine, tea, and carbonated beverages. They tend to irritate your bladder.  Empty your bladder often. Avoid holding urine for long periods of time.  Empty your bladder before and after sexual intercourse.  After a bowel movement, women should cleanse from front to back. Use each tissue only once. SEEK MEDICAL CARE IF:   You have back pain.  You develop a fever.  Your symptoms do not begin to resolve within 3 days. SEEK IMMEDIATE MEDICAL CARE IF:   You have severe back pain or lower abdominal pain.  You develop chills.  You have nausea or vomiting.  You have continued burning or discomfort with urination. MAKE SURE YOU:   Understand these instructions.  Will watch your condition.  Will get help right away if you are not doing well or get worse. Document Released: 03/23/2005 Document Revised: 12/13/2011 Document Reviewed: 07/22/2011 [facility] Patient Information 2014 [place], [place].

## 2013-10-18 NOTE — ED Notes (Signed)
Patient is alert and oriented x3.  She was given DC instructions and follow up visit instructions.  Patient gave verbal understanding. She was DC ambulatory under her own power to home.  V/S stable.  He was not showing any signs of distress on DC 

## 2013-10-18 NOTE — ED Provider Notes (Signed)
CSN: [id number]     Arrival date & time 10/18/13  1953 History   First MD Initiated Contact with Patient 10/18/13 1956     Chief Complaint  Patient presents with  . Abdominal Pain    bilateral lower x3 days, hx of ovarian cysts     (Consider location/radiation/quality/duration/timing/severity/associated sxs/prior Treatment) HPI  This is a 43 year old female with history of ovarian cyst and recent history of chronic pelvic pain who presents with abdominal pain. Patient reports continuous crampy lower abdominal pain. [facility] She's had multiple ultrasounds over the last 2 months and is scheduled for surgery on may first. Patient rates pain at 10 out of 10. Patient states that she took her last Percocet this morning. Her pain is improved with Percocet. She denies any vomiting or diarrhea. She does endorse vaginal discharge. Pain is nonradiating.  Past Medical History  Diagnosis Date  . Heart murmur   . Asthma   . Hypertension   . Anxiety   . Ovarian cyst    Past Surgical History  Procedure Laterality Date  . Appendectomy    . Cesarean section    . Abdominal hysterectomy     Family History  Problem Relation Age of Onset  . Cancer Mother [id number]    breast  . Hypertension Mother   . Heart disease Father   . Stroke Father   . Arthritis Daughter   . Seizures Son   . Hypertension Maternal Grandmother   . Arthritis Daughter    History  Substance Use Topics  . Smoking status: Current Some Day Smoker -- 0.50 packs/day    Types: Cigarettes  . Smokeless tobacco: Never Used  . Alcohol Use: No   OB History   Grav Para Term Preterm Abortions TAB SAB Ect Mult Living   3 3 3       2      Review of Systems  Constitutional: Negative for fever.  Respiratory: Negative for chest tightness and shortness of breath.   Cardiovascular: Negative for chest pain.  Gastrointestinal: Positive for abdominal pain. Negative for nausea, vomiting and  diarrhea.  Genitourinary: Positive for vaginal discharge. Negative for dysuria and flank pain.  Musculoskeletal: Negative for back pain.  Skin: Negative for wound.  Neurological: Negative for headaches.  All other systems reviewed and are negative.     Allergies  Lisinopril; Asa; and Ultram  Home Medications   Prior to Admission medications   Medication Sig Start Date End Date Taking? Authorizing Provider  albuterol (PROVENTIL HFA;VENTOLIN HFA) 108 (90 BASE) MCG/ACT inhaler Inhale 2 puffs into the lungs every 6 (six) hours as needed for wheezing or shortness of breath.   Yes Historical Provider, MD  atenolol-chlorthalidone (TENORETIC) 100-25 MG per tablet Take 1 tablet by mouth daily.   Yes Historical Provider, MD  nebivolol (BYSTOLIC) 5 MG tablet Take 5 mg by mouth daily.   Yes Historical Provider, MD  oxyCODONE-acetaminophen (PERCOCET/ROXICET) 5-325 MG per tablet Take 2 tablets by mouth every 4 (four) hours as needed for severe pain. 10/15/13  Yes Jean RosenthalSusan P Lineberry, NP   BP 125/78  Pulse 63  Temp(Src) 98.1 F (36.7 C) (Oral)  Resp 18  Ht 6\' 1"  (1.854 m)  Wt 222 lb (100.699 kg)  BMI 29.30 kg/m2  SpO2 98% Physical Exam  Nursing note and vitals reviewed. Constitutional: She is oriented to person, place, and time. She appears well-developed and well-nourished. No distress.  HENT:  Head: Normocephalic  and atraumatic.  Cardiovascular: Normal rate and regular rhythm.   Murmur heard. Pulmonary/Chest: Effort normal and breath sounds normal. No respiratory distress. She has no wheezes.  Abdominal: Soft. Bowel sounds are normal. There is tenderness. There is no rebound and no guarding.  Diffuse suprapubic tenderness  Genitourinary: Vagina normal.  Copious white frothy vaginal discharge noted at the cervical os, no cervical motion tenderness, bilateral adnexal tenderness without mass  Neurological: She is alert and oriented to person, place, and time.  Skin: Skin is warm and dry.   Psychiatric: She has a normal mood and affect.    ED Course  Procedures (including critical care time) Labs Review Labs Reviewed  WET PREP, GENITAL - Abnormal; Notable for the following:    Yeast Wet Prep HPF POC RARE (*)    Trich, Wet Prep MANY (*)    Clue Cells Wet Prep HPF POC MANY (*)    WBC, Wet Prep HPF POC MANY (*)    All other components within normal limits  CBC WITH DIFFERENTIAL - Abnormal; Notable for the following:    WBC 11.0 (*)    Lymphs Abs 4.2 (*)    All other components within normal limits  COMPREHENSIVE METABOLIC PANEL - Abnormal; Notable for the following:    Glucose, Bld 106 (*)    Total Bilirubin 0.2 (*)    All other components within normal limits  URINALYSIS, ROUTINE W REFLEX MICROSCOPIC - Abnormal; Notable for the following:    APPearance CLOUDY (*)    Hgb urine dipstick MODERATE (*)    Protein, ur 30 (*)    Leukocytes, UA MODERATE (*)    All other components within normal limits  URINE MICROSCOPIC-ADD ON - Abnormal; Notable for the following:    Squamous Epithelial / LPF FEW (*)    Bacteria, UA MANY (*)    All other components within normal limits  GC/CHLAMYDIA PROBE AMP  RPR  HIV ANTIBODY (ROUTINE TESTING)  POC URINE PREG, ED  I-STAT CG4 LACTIC ACID, ED    Imaging Review No results found.   EKG Interpretation None      MDM   Final diagnoses:  Chronic abdominal pain  UTI (lower urinary tract infection)  Trichomoniasis    Patient presents with abdominal pain recent history of the same known ovarian cyst and tubo-ovarian abscess. Recent ultrasound on 4/21 showed no evidence of torsion. Patient's abdominal pain is suprapubic and nonlateralizing. At this time have low suspicion for ovarian torsion. I reviewed the patient's chart, and she is currently on metronidazole for UTI. Cultures are resistant to metronidazole. We'll switch her to Cipro. Pelvic exam notable for copious vaginal discharge. Positive for trichomoniasis. We'll treat for  STDs. We'll give patient a short course of pain medication and ciprofloxacin for UTI. She's to followup with GYN on Monday.  After history, exam, and medical workup I feel the patient has been appropriately medically screened and is safe for discharge home. Pertinent diagnoses were discussed with the patient. Patient was given return precautions.     Shon Batonourtney F Horton, MD 10/18/13 2159

## 2013-10-18 NOTE — ED Notes (Signed)
Patient is alert and oriented x3.  She is complaining of lower abdominal pain that started three days ago. Patient states that she has bilateral cyst on her ovaries that is causing her pain and she has a surgery scheduled For this.  Patient states that she his here for pain management.  Currently she rates her pain 10 of 10.

## 2013-10-18 NOTE — ED Notes (Signed)
Per EMS, patient from home, c/o bilateral lower abd pain x3 days. Reports history of HTN, takes meds at night time-- has not had tonight's meds. Patient states she has hx of ovarian cysts, also c/o nausea.

## 2013-10-18 NOTE — ED Notes (Signed)
Bed: [id number] Expected date:  Expected time:  Means of arrival:  Comments: EMS 6042 F Bilateral Lower Abd Pain Hx ovarian cysts

## 2013-10-18 NOTE — ED Notes (Signed)
CG4 lactic Acid given to Dr. Wilkie AyeHorton.

## 2013-10-19 LAB — GC/CHLAMYDIA PROBE AMP
CT Probe RNA: NEGATIVE
GC PROBE AMP APTIMA: NEGATIVE

## 2013-10-19 LAB — HIV ANTIBODY (ROUTINE TESTING W REFLEX): HIV: NONREACTIVE

## 2013-10-19 LAB — RPR

## 2013-10-22 NOTE — Patient Instructions (Addendum)
   Your procedure is scheduled on:  Friday, May 1  [facility]  1130 AM Pick up the phone at the desk and dial [phone] and inform us of your arrival.  Please call this number if you have any problems the morning of surgery: [phone]  Remember: Do not eat food after midnight: Thursday Do not drink clear liquids after: 9 AM Friday, day of surgery Take these medicines the morning of surgery with a SIP OF WATER:  Blood pressure meds take tonight- Thursday.  Bring albuterol inhaler with you on day of surgery.  Do not wear jewelry, make-up, or FINGER nail polish No metal in your hair or on your body. Do not wear lotions, powders, perfumes.  You may wear deodorant.  Do not bring valuables to the hospital. Contacts, dentures or bridgework may not be worn into surgery.  Leave suitcase in the car. After Surgery it may be brought to your room. For patients being admitted to the hospital, checkout time is 11:00am the day of discharge.    Patients discharged on the day of surgery will not be allowed to drive home.    Home with boyfriend Darrin or sister Kathie RhodesBetty

## 2013-10-24 ENCOUNTER — Encounter: Payer: Self-pay | Admitting: Obstetrics & Gynecology

## 2013-10-24 ENCOUNTER — Ambulatory Visit: Payer: Self-pay | Admitting: Family Medicine

## 2013-10-24 ENCOUNTER — Encounter (HOSPITAL_COMMUNITY): Payer: Self-pay

## 2013-10-24 ENCOUNTER — Other Ambulatory Visit: Payer: Self-pay

## 2013-10-24 ENCOUNTER — Encounter (HOSPITAL_COMMUNITY)
Admission: RE | Admit: 2013-10-24 | Discharge: 2013-10-24 | Disposition: A | Discharge status: Home / Self Care | Payer: Self-pay | Source: Ambulatory Visit | Attending: Family Medicine | Admitting: Family Medicine

## 2013-10-24 DIAGNOSIS — Z01812 Encounter for preprocedural laboratory examination: Secondary | ICD-10-CM | POA: Insufficient documentation

## 2013-10-24 HISTORY — DX: Personal history of other medical treatment: Z92.89

## 2013-10-24 LAB — BASIC METABOLIC PANEL
BUN: 11 mg/dL (ref 6–23)
CO2: 28 mEq/L (ref 19–32)
Calcium: 10.1 mg/dL (ref 8.4–10.5)
Chloride: 102 mEq/L (ref 96–112)
Creatinine, Ser: 0.65 mg/dL (ref 0.50–1.10)
Glucose, Bld: 105 mg/dL — ABNORMAL HIGH (ref 70–99)
POTASSIUM: 4.2 meq/L (ref 3.7–5.3)
Sodium: 142 mEq/L (ref 137–147)

## 2013-10-24 LAB — CBC
HCT: 44.3 % (ref 36.0–46.0)
Hemoglobin: 14.9 g/dL (ref 12.0–15.0)
MCH: 30.3 pg (ref 26.0–34.0)
MCHC: 33.6 g/dL (ref 30.0–36.0)
MCV: 90.2 fL (ref 78.0–100.0)
PLATELETS: 238 10*3/uL (ref 150–400)
RBC: 4.91 MIL/uL (ref 3.87–5.11)
RDW: 13.7 % (ref 11.5–15.5)
WBC: 9.7 10*3/uL (ref 4.0–10.5)

## 2013-10-24 NOTE — Pre-Procedure Instructions (Signed)
SDS BB History Log given to Lab for patient's history of blood transfusion. 

## 2013-10-25 ENCOUNTER — Encounter (HOSPITAL_COMMUNITY)
Admission: RE | Disposition: A | Discharge status: Home / Self Care | Payer: Self-pay | Source: Ambulatory Visit | Attending: Family Medicine

## 2013-10-25 ENCOUNTER — Encounter (HOSPITAL_COMMUNITY): Payer: Self-pay | Admitting: Anesthesiology

## 2013-10-25 ENCOUNTER — Ambulatory Visit (HOSPITAL_COMMUNITY): Payer: Self-pay | Admitting: Anesthesiology

## 2013-10-25 ENCOUNTER — Observation Stay (HOSPITAL_COMMUNITY)
Admission: RE | Admit: 2013-10-25 | Discharge: 2013-10-26 | Disposition: A | Discharge status: Home / Self Care | Payer: Self-pay | Source: Ambulatory Visit | Attending: Family Medicine | Admitting: Family Medicine

## 2013-10-25 DIAGNOSIS — Z90710 Acquired absence of both cervix and uterus: Secondary | ICD-10-CM | POA: Insufficient documentation

## 2013-10-25 DIAGNOSIS — F17200 Nicotine dependence, unspecified, uncomplicated: Secondary | ICD-10-CM | POA: Insufficient documentation

## 2013-10-25 DIAGNOSIS — R011 Cardiac murmur, unspecified: Secondary | ICD-10-CM | POA: Insufficient documentation

## 2013-10-25 DIAGNOSIS — N831 Corpus luteum cyst of ovary, unspecified side: Secondary | ICD-10-CM | POA: Insufficient documentation

## 2013-10-25 DIAGNOSIS — Z90722 Acquired absence of ovaries, bilateral: Secondary | ICD-10-CM

## 2013-10-25 DIAGNOSIS — N83209 Unspecified ovarian cyst, unspecified side: Secondary | ICD-10-CM | POA: Insufficient documentation

## 2013-10-25 DIAGNOSIS — N7011 Chronic salpingitis: Secondary | ICD-10-CM

## 2013-10-25 DIAGNOSIS — N7013 Chronic salpingitis and oophoritis: Secondary | ICD-10-CM | POA: Insufficient documentation

## 2013-10-25 DIAGNOSIS — N949 Unspecified condition associated with female genital organs and menstrual cycle: Principal | ICD-10-CM | POA: Insufficient documentation

## 2013-10-25 DIAGNOSIS — I10 Essential (primary) hypertension: Secondary | ICD-10-CM | POA: Insufficient documentation

## 2013-10-25 DIAGNOSIS — Z9889 Other specified postprocedural states: Secondary | ICD-10-CM

## 2013-10-25 DIAGNOSIS — F411 Generalized anxiety disorder: Secondary | ICD-10-CM | POA: Insufficient documentation

## 2013-10-25 DIAGNOSIS — J45909 Unspecified asthma, uncomplicated: Secondary | ICD-10-CM | POA: Insufficient documentation

## 2013-10-25 HISTORY — PX: LAPAROSCOPIC BILATERAL SALPINGO OOPHERECTOMY: SHX5890

## 2013-10-25 SURGERY — SALPINGO-OOPHORECTOMY, BILATERAL, LAPAROSCOPIC
Anesthesia: General | Site: Abdomen

## 2013-10-25 MED ORDER — HYDROMORPHONE HCL PF 1 MG/ML IJ SOLN
0.5000 mg | INTRAMUSCULAR | Status: DC | PRN
  Administered 2013-10-25 (×3): 0.5 mg via INTRAVENOUS

## 2013-10-25 MED ORDER — NEOSTIGMINE METHYLSULFATE 10 MG/10ML IV SOLN
INTRAVENOUS | Status: AC
  Filled 2013-10-25: qty 1

## 2013-10-25 MED ORDER — PROPOFOL 10 MG/ML IV EMUL
INTRAVENOUS | Status: AC
  Filled 2013-10-25: qty 20

## 2013-10-25 MED ORDER — OXYCODONE-ACETAMINOPHEN 5-325 MG PO TABS
1.0000 | ORAL_TABLET | ORAL | Status: DC | PRN
  Administered 2013-10-26 (×2): 2 via ORAL
  Filled 2013-10-25 (×2): qty 2

## 2013-10-25 MED ORDER — HYDROMORPHONE HCL PF 1 MG/ML IJ SOLN
INTRAMUSCULAR | Status: AC
  Filled 2013-10-25: qty 1

## 2013-10-25 MED ORDER — PROPOFOL 10 MG/ML IV BOLUS
INTRAVENOUS | Status: DC | PRN
  Administered 2013-10-25: 20 mg via INTRAVENOUS
  Administered 2013-10-25: 180 mg via INTRAVENOUS

## 2013-10-25 MED ORDER — CEFAZOLIN SODIUM-DEXTROSE 2-3 GM-% IV SOLR
INTRAVENOUS | Status: AC
  Filled 2013-10-25: qty 50

## 2013-10-25 MED ORDER — ONDANSETRON HCL 4 MG/2ML IJ SOLN
INTRAMUSCULAR | Status: DC | PRN
  Administered 2013-10-25: 4 mg via INTRAVENOUS

## 2013-10-25 MED ORDER — MIDAZOLAM HCL 2 MG/2ML IJ SOLN
INTRAMUSCULAR | Status: DC | PRN
  Administered 2013-10-25: 2 mg via INTRAVENOUS

## 2013-10-25 MED ORDER — GLYCOPYRROLATE 0.2 MG/ML IJ SOLN
INTRAMUSCULAR | Status: AC
  Filled 2013-10-25: qty 3

## 2013-10-25 MED ORDER — ONDANSETRON HCL 4 MG/2ML IJ SOLN
INTRAMUSCULAR | Status: AC
  Filled 2013-10-25: qty 2

## 2013-10-25 MED ORDER — LACTATED RINGERS IV SOLN
INTRAVENOUS | Status: DC
  Administered 2013-10-25: 13:00:00 via INTRAVENOUS
  Administered 2013-10-25: 1000 mL via INTRAVENOUS
  Administered 2013-10-25: 12:00:00 via INTRAVENOUS

## 2013-10-25 MED ORDER — LACTATED RINGERS IV SOLN
INTRAVENOUS | Status: DC
  Administered 2013-10-25: 20:00:00 via INTRAVENOUS

## 2013-10-25 MED ORDER — FENTANYL CITRATE 0.05 MG/ML IJ SOLN
INTRAMUSCULAR | Status: AC
  Filled 2013-10-25: qty 2

## 2013-10-25 MED ORDER — MEPERIDINE HCL 25 MG/ML IJ SOLN
6.2500 mg | INTRAMUSCULAR | Status: DC | PRN
Start: 2013-10-25 — End: 2013-10-25

## 2013-10-25 MED ORDER — BUPIVACAINE HCL (PF) 0.25 % IJ SOLN
INTRAMUSCULAR | Status: DC | PRN
  Administered 2013-10-25: 4 mL

## 2013-10-25 MED ORDER — BUPIVACAINE HCL (PF) 0.25 % IJ SOLN
INTRAMUSCULAR | Status: AC
  Filled 2013-10-25: qty 30

## 2013-10-25 MED ORDER — ALBUTEROL SULFATE HFA 108 (90 BASE) MCG/ACT IN AERS
INHALATION_SPRAY | RESPIRATORY_TRACT | Status: DC | PRN
  Administered 2013-10-25: 2 via RESPIRATORY_TRACT

## 2013-10-25 MED ORDER — MIDAZOLAM HCL 2 MG/2ML IJ SOLN
INTRAMUSCULAR | Status: AC
  Filled 2013-10-25: qty 2

## 2013-10-25 MED ORDER — CEFAZOLIN SODIUM-DEXTROSE 2-3 GM-% IV SOLR
2.0000 g | INTRAVENOUS | Status: AC
  Administered 2013-10-25: 2 g via INTRAVENOUS

## 2013-10-25 MED ORDER — HYDROMORPHONE HCL PF 1 MG/ML IJ SOLN
0.2500 mg | INTRAMUSCULAR | Status: DC | PRN
Start: 2013-10-25 — End: 2013-10-25
  Administered 2013-10-25 (×5): 0.5 mg via INTRAVENOUS

## 2013-10-25 MED ORDER — FENTANYL CITRATE 0.05 MG/ML IJ SOLN
INTRAMUSCULAR | Status: AC
  Filled 2013-10-25: qty 5

## 2013-10-25 MED ORDER — LIDOCAINE HCL (CARDIAC) 20 MG/ML IV SOLN
INTRAVENOUS | Status: AC
  Filled 2013-10-25: qty 5

## 2013-10-25 MED ORDER — LACTATED RINGERS IR SOLN
Status: DC | PRN
Start: 2013-10-25 — End: 2013-10-25
  Administered 2013-10-25: 1

## 2013-10-25 MED ORDER — DEXAMETHASONE SODIUM PHOSPHATE 10 MG/ML IJ SOLN
INTRAMUSCULAR | Status: DC | PRN
  Administered 2013-10-25: 10 mg via INTRAVENOUS

## 2013-10-25 MED ORDER — ROCURONIUM BROMIDE 100 MG/10ML IV SOLN
INTRAVENOUS | Status: DC | PRN
  Administered 2013-10-25: 50 mg via INTRAVENOUS
  Administered 2013-10-25: 10 mg via INTRAVENOUS

## 2013-10-25 MED ORDER — FENTANYL CITRATE 0.05 MG/ML IJ SOLN
INTRAMUSCULAR | Status: DC | PRN
  Administered 2013-10-25 (×4): 50 ug via INTRAVENOUS

## 2013-10-25 MED ORDER — NEOSTIGMINE METHYLSULFATE 10 MG/10ML IV SOLN
INTRAVENOUS | Status: DC | PRN
  Administered 2013-10-25: 3 mg via INTRAVENOUS

## 2013-10-25 MED ORDER — OXYCODONE-ACETAMINOPHEN 5-325 MG PO TABS: 1.0000 | ORAL_TABLET | Freq: Four times a day (QID) | ORAL | Status: DC | PRN

## 2013-10-25 MED ORDER — FENTANYL CITRATE 0.05 MG/ML IJ SOLN
50.0000 ug | INTRAMUSCULAR | Status: DC | PRN
  Administered 2013-10-25 (×2): 50 ug via INTRAVENOUS

## 2013-10-25 MED ORDER — ROCURONIUM BROMIDE 100 MG/10ML IV SOLN
INTRAVENOUS | Status: AC
  Filled 2013-10-25: qty 1

## 2013-10-25 MED ORDER — KETOROLAC TROMETHAMINE 30 MG/ML IJ SOLN
INTRAMUSCULAR | Status: AC
  Filled 2013-10-25: qty 1

## 2013-10-25 MED ORDER — METOCLOPRAMIDE HCL 5 MG/ML IJ SOLN: 10.0000 mg | Freq: Once | INTRAMUSCULAR | Status: DC | PRN

## 2013-10-25 MED ORDER — LIDOCAINE HCL (CARDIAC) 20 MG/ML IV SOLN
INTRAVENOUS | Status: DC | PRN
  Administered 2013-10-25: 30 mg via INTRAVENOUS
  Administered 2013-10-25: 70 mg via INTRAVENOUS

## 2013-10-25 MED ORDER — GLYCOPYRROLATE 0.2 MG/ML IJ SOLN
INTRAMUSCULAR | Status: DC | PRN
  Administered 2013-10-25: 0.6 mg via INTRAVENOUS

## 2013-10-25 MED ORDER — DEXAMETHASONE SODIUM PHOSPHATE 10 MG/ML IJ SOLN
INTRAMUSCULAR | Status: AC
  Filled 2013-10-25: qty 1

## 2013-10-25 SURGICAL SUPPLY — 28 items
CABLE HIGH FREQUENCY MONO STRZ (ELECTRODE) ×3 IMPLANT
CANISTER SUCT 3000ML (MISCELLANEOUS) ×3 IMPLANT
CHLORAPREP W/TINT 26ML (MISCELLANEOUS) ×3 IMPLANT
CLOTH BEACON ORANGE TIMEOUT ST (SAFETY) ×3 IMPLANT
CONT PATH 16OZ SNAP LID 3702 (MISCELLANEOUS) ×3 IMPLANT
DECANTER SPIKE VIAL GLASS SM (MISCELLANEOUS) ×3 IMPLANT
DERMABOND ADVANCED (GAUZE/BANDAGES/DRESSINGS) ×1
DERMABOND ADVANCED .7 DNX12 (GAUZE/BANDAGES/DRESSINGS) ×2 IMPLANT
GLOVE BIOGEL PI IND STRL 7.0 (GLOVE) ×2 IMPLANT
GLOVE BIOGEL PI INDICATOR 7.0 (GLOVE) ×1
GLOVE ECLIPSE 7.0 STRL STRAW (GLOVE) ×6 IMPLANT
GOWN STRL REUS W/TWL LRG LVL3 (GOWN DISPOSABLE) ×9 IMPLANT
NS IRRIG 1000ML POUR BTL (IV SOLUTION) ×3 IMPLANT
PACK LAVH (CUSTOM PROCEDURE TRAY) ×3 IMPLANT
PAD OB MATERNITY 4.3X12.25 (PERSONAL CARE ITEMS) ×3 IMPLANT
POUCH SPECIMEN RETRIEVAL 10MM (ENDOMECHANICALS) ×3 IMPLANT
PROTECTOR NERVE ULNAR (MISCELLANEOUS) ×6 IMPLANT
SCALPEL HARMONIC ACE (MISCELLANEOUS) ×3 IMPLANT
SET IRRIG TUBING LAPAROSCOPIC (IRRIGATION / IRRIGATOR) ×3 IMPLANT
SUT VICRYL 0 UR6 27IN ABS (SUTURE) ×3 IMPLANT
SUT VICRYL 4-0 PS2 18IN ABS (SUTURE) ×6 IMPLANT
TOWEL OR 17X24 6PK STRL BLUE (TOWEL DISPOSABLE) ×6 IMPLANT
TRAY FOLEY CATH 14FR (SET/KITS/TRAYS/PACK) ×3 IMPLANT
TROCAR BALLN 12MMX100 BLUNT (TROCAR) ×3 IMPLANT
TROCAR OPTI TIP 5M 100M (ENDOMECHANICALS) ×6 IMPLANT
TROCAR XCEL DIL TIP R 11M (ENDOMECHANICALS) ×3 IMPLANT
WARMER LAPAROSCOPE (MISCELLANEOUS) ×3 IMPLANT
WATER STERILE IRR 1000ML POUR (IV SOLUTION) ×3 IMPLANT

## 2013-10-25 NOTE — Op Note (Signed)
Jill Wright  PROCEDURE DATE: 10/25/2013  PREOPERATIVE DIAGNOSES: Pelvic pain, ovarian cyst, hydrosalpinges  POSTOPERATIVE DIAGNOSES: The same  PROCEDURE: Laparoscopic bilateral salpingoophorectomy  SURGEON:  Dr. Reva Boresanya S [name]  ASSISTANT: Dr. Willodean Rosenthalarolyn Harraway-Smith  ANESTHESIOLOGIST: Dr. [name], MD   INDICATIONS: 43 y.o. [phone] with aforementioned preoperative diagnoses here today for definitive surgical management.   Risks of surgery were discussed with the patient including but not limited to: bleeding which may require transfusion or reoperation; infection which may require antibiotics; injury to bowel, bladder, ureters or other surrounding organs; need for additional procedures including laparotomy; thromboembolic phenomenon, incisional problems and other postoperative/anesthesia complications. Written informed consent was obtained.    FINDINGS:  Absent uterus, adhesions of ovaries and tubes to side walls, with evidence of endometriosis.  Omental adhesions to anterior abdominal wall. Normal upper abdomen.  ANESTHESIA:    GETT  ESTIMATED BLOOD LOSS: 100 ml  SPECIMENS:  Bilateral ovaries and fallopian tubes  COMPLICATIONS: None immediately known  PROCEDURE IN DETAIL:  The patient received intravenous antibiotics and had sequential compression devices applied to her lower extremities while in the preoperative area.  She was then taken to the operating room where general anesthesia was administered and was found to be adequate.  She was placed in the dorsal lithotomy position, and was prepped and draped in a sterile manner.  A Foley catheter was inserted into her bladder and attached to constant drainage and a sponge on a stick placed inside the vagina. After an adequate timeout was performed, attention was then turned to the patient's abdomen where a 11-mm skin incision was made in the RUQ. The Optiview was used and 11 mm trocar placed under direct visualization.   Intraperitoneal placement was confirmed and insufflation done.  A survey of the patient's pelvis and abdomen revealed the findings above.   Bilateral 5-mm lower quadrant ports were then placed under direct visualization.   On the left side, the infundibulopelvic was then clamped and transected with the Harmonic device.  Blunt and sharp dissection was used to remove the tube and ovary.  The ureter was identified inferior to the operative field. Excellent hemostasis was noted.  On the right side, the infundibulopelvic was then clamped and transected with the Harmonic device.  Blunt and sharp dissection was used to remove the tube and ovary with care being taken to stay very close to the specimen.  The ureter was not seen with confidence on this side but was not in the operative field either.  There was a good amount of endometriosis noted on this side under the tube and ovary and the peritoneum was thickened. The specimen was then removed from the abdomen through the 11-mm port using an Endocatch bag, under direct visualization.  The operative site was surveyed, and there was some oozing at the right IP.  Surgi-flow was placed at the site.  No intraoperative injury to other surrounding organs was noted.  The abdomen was desufflated and all instruments were then removed from the patient's abdomen. No fascial closure was needed.  All skin incisions were closed with 3-0 Vicryl subcuticular stitches/Dermabond.    Reva Boresanya S Masson Nalepa MD 10/25/2013 3:05 PM

## 2013-10-25 NOTE — Interval H&P Note (Signed)
History and Physical Interval Note:  10/25/2013 12:22 PM  Jill Wright  has presented today for surgery, with the diagnosis of  Bilateral hydrosalpinges  The various methods of treatment have been discussed with the patient and family. After consideration of risks, benefits and other options for treatment, the patient has consented to  Procedure(s) with comments: LAPAROSCOPIC BILATERAL SALPINGO OOPHORECTOMY  (Bilateral) - Possible laparotomy EXPLORATORY LAPAROTOMY (N/A) as a surgical intervention. She strongly desires laparotomy, however, I do not feel this is in her best interest.  15 minutes were taken in explaining the benefits of laparoscopy, including improved visibility, decreased scarring, decreased risk of infection and bleeding, need for post surgical care, etc. She seems most concerned with incisions on her abdomen.  It is not felt that this should out-weigh the benefits of laparoscopic surgical approach.  The patient's history has been reviewed, patient examined, no change in status, stable for surgery.  I have reviewed the patient's chart and labs.  Questions were answered to the patient's satisfaction.     Reva Boresanya S Alida Greiner

## 2013-10-25 NOTE — Discharge Instructions (Signed)
Diagnostic Laparoscopy Laparoscopy is a surgical procedure. It is used to diagnose and treat diseases inside the belly (abdomen). It is usually a brief, common, and relatively simple procedure. The laparoscopeis a thin, lighted, pencil-sized instrument. It is like a telescope. It is inserted into your abdomen through a small cut (incision). Your caregiver can look at the organs inside your body through this instrument. He or she can see if there is anything abnormal. Laparoscopy can be done either in a hospital or outpatient clinic. You may be given a mild sedative to help you relax before the procedure. Once in the operating room, you will be given a drug to make you sleep (general anesthesia). Laparoscopy usually lasts less than 1 hour. After the procedure, you will be monitored in a recovery area until you are stable and doing well. Once you are home, it will take 2 to 3 days to fully recover. RISKS AND COMPLICATIONS  Laparoscopy has relatively few risks. Your caregiver will discuss the risks with you before the procedure. Some problems that can occur include:  Infection.  Bleeding.  Damage to other organs.  Anesthetic side effects. PROCEDURE Once you receive anesthesia, your surgeon inflates the abdomen with a harmless gas (carbon dioxide). This makes the organs easier to see. The laparoscope is inserted into the abdomen through a small incision. This allows your surgeon to see into the abdomen. Other small instruments are also inserted into the abdomen through other small openings. Many surgeons attach a video camera to the laparoscope to enlarge the view. During a diagnostic laparoscopy, the surgeon may be looking for inflammation, infection, or cancer. Your surgeon may take tissue samples(biopsies). The samples are sent to a specialist in looking at cells and tissue samples (pathologist). The pathologist examines them under a microscope. Biopsies can help to diagnose or confirm a  disease. AFTER THE PROCEDURE   The gas is released from inside the abdomen.  The incisions are closed with stitches (sutures). Because these incisions are small (usually less than 1/2 inch), there is usually minimal discomfort after the procedure. There may be some mild discomfort in the throat. This is from the tube placed in the throat while you were sleeping. You may have some mild abdominal discomfort. There may also be discomfort from the instrument placement incisions in the abdomen.  The recovery time is shortened as long as there are no complications.  You will rest in a recovery room until stable and doing well. As long as there are no complications, you may be allowed to go home. FINDING OUT THE RESULTS OF YOUR TEST Not all test results are available during your visit. If your test results are not back during the visit, make an appointment with your caregiver to find out the results. Do not assume everything is normal if you have not heard from your caregiver or the medical facility. It is important for you to follow up on all of your test results. HOME CARE INSTRUCTIONS   Take all medicines as directed.  Only take over-the-counter or prescription medicines for pain, discomfort, or fever as directed by your caregiver.  Resume daily activities as directed.  Showers are preferred over baths.  You may resume sexual activities in 1 week or as directed.  Do not drive while taking narcotics. SEEK MEDICAL CARE IF:   There is increasing abdominal pain.  There is new pain in the shoulders (shoulder strap areas).  You feel lightheaded or faint.  You have the chills.  You or  your child has an oral temperature above 102 F (38.9 C).  There is pus-like (purulent) drainage from any of the wounds.  You are unable to pass gas or have a bowel movement.  You feel sick to your stomach (nauseous) or throw up (vomit). MAKE SURE YOU:   Understand these instructions.  Will watch  your condition.  Will get help right away if you are not doing well or get worse. Document Released: 09/19/2000 Document Revised: 10/08/2012 Document Reviewed: 06/13/2007 [facility] Patient Information 2014 [place], [place]. Bilateral Salpingo-Oophorectomy Bilateral salpingo-oophorectomy is the surgical removal of both fallopian tubes and both ovaries. The ovaries are small organs that produce eggs in women. The fallopian tubes transport the egg from the ovary to the womb (uterus). Usually, when this surgery is done, the uterus was previously removed. A bilateral salpingo-oophorectomy may be done to treat cancer or to reduce the risk of cancer in women who are at high risk. Removing both fallopian tubes and both ovaries will make you unable to become pregnant (sterile). It will also put you into menopause so that you will no longer have menstrual periods and may have menopausal symptoms such as hot flashes, night sweats, and mood changes. It will not affect your sex drive. LET [facility] LLCYOUR HEALTH CARE PROVIDER KNOW ABOUT:  Any allergies you have.  All medicines you are taking, including vitamins, herbs, eye drops, creams, and over-the-counter medicines.  Previous problems you or members of your family have had with the use of anesthetics.  Any blood disorders you have.  Previous surgeries you have had.  Medical conditions you have. RISKS AND COMPLICATIONS Generally, this is a safe procedure. However, as with any procedure, complications can occur. Possible complications include:  Injury to surrounding organs.  Bleeding.  Infection.  Blood clots in the legs or lungs.  Problems related to anesthesia. BEFORE THE PROCEDURE  Ask your health care provider about changing or stopping your regular medicines. You may need to stop taking certain medicines, such as aspirin or blood thinners, at least 1 week before the surgery.  Do not eat or drink anything for at least 8 hours before the surgery.  If  you smoke, do not smoke for at least 2 weeks before the surgery.  Make plans to have someone drive you home after the procedure or after your hospital stay. Also arrange for someone to help you with activities during recovery. PROCEDURE   You will be given medicine to help you relax before the procedure (sedative). You will then be given medicine to make you sleep through the procedure (general anesthetic). These medicines will be given through an IV access tube that is put into one of your veins.  Once you are asleep, your lower abdomen will be shaved and cleaned. A thin, flexible tube (catheter) will be placed in your bladder.  The surgeon may use a laparoscopic, robotic, or open technique for this surgery:  In the laparoscopic technique, the surgery is done through two small cuts (incisions) in the abdomen. A thin, lighted tube with a tiny camera on the end (laparoscope) is inserted into one of the incisions. The tools needed for the procedure are put through the other incision.  A robotic technique may be chosen to perform complex surgery in a small space. In the robotic technique, small incisions will be made. A camera and surgical instruments are passed through the incisions. Surgical instruments will be controlled with the help of a robotic arm.  In the open technique, the surgery  is done through one large incision in the abdomen.  Using any of these techniques, the surgeon removes the fallopian tubes and ovaries. The blood vessels will be clamped and tied.  The surgeon then uses staples or stitches to close the incision or incisions. AFTER THE PROCEDURE  You will be taken to a recovery area where you will be monitored for 1 to 3 hours. Your blood pressure, pulse, and temperature will be checked often. You will remain in the recovery area until you are stable and waking up.  If the laparoscopic technique was used, you may be allowed to go home after several hours. You may have some  shoulder pain after the laparoscopic procedure. This is normal and usually goes away in a day or two.  If the open technique was used, you will be admitted to the hospital for a couple of days.  You will be given pain medicine as needed.  The IV access tube and catheter will be removed before you are discharged. Document Released: 06/13/2005 Document Revised: 02/13/2013 Document Reviewed: 12/05/2012 [facility] Patient Information 2014 [place]Care, [place].

## 2013-10-25 NOTE — Anesthesia Postprocedure Evaluation (Signed)
Anesthesia Post Note  Patient: Jill Wright  Procedure(s) Performed: Procedure(s) (LRB): LAPAROSCOPIC BILATERAL SALPINGO OOPHORECTOMY  (Bilateral)  Anesthesia type: General  Patient location: PACU  Post pain: Pain level controlled  Post assessment: Post-op Vital signs reviewed  Last Vitals:  Filed Vitals:   10/25/13 1600  BP: 115/72  Pulse: 60  Temp: 36.9 C  Resp: 10    Post vital signs: Reviewed  Level of consciousness: sedated  Complications: No apparent anesthesia complications

## 2013-10-25 NOTE — Transfer of Care (Signed)
Immediate Anesthesia Transfer of Care Note  Patient: Jill Wright CoilMonique L Bohlin  Procedure(s) Performed: Procedure(s): LAPAROSCOPIC BILATERAL SALPINGO OOPHORECTOMY  (Bilateral)  Patient Location: PACU  Anesthesia Type:General  Level of Consciousness: awake, sedated and patient cooperative  Airway & Oxygen Therapy: Patient Spontanous Breathing and Patient connected to nasal cannula oxygen  Post-op Assessment: Report given to PACU RN and Post -op Vital signs reviewed and stable  Post vital signs: Reviewed and stable  Complications: No apparent anesthesia complications

## 2013-10-25 NOTE — Anesthesia Preprocedure Evaluation (Signed)
Anesthesia Evaluation  Patient identified by MRN, date of birth, ID band Patient awake    Reviewed: Allergy & Precautions, H&P , NPO status , Patient's Chart, lab work & pertinent test results, reviewed documented beta blocker date and time   Airway Mallampati: II TM Distance: >3 FB Neck ROM: Full    Dental no notable dental hx. (+) Teeth Intact   Pulmonary asthma , Current Smoker,  breath sounds clear to auscultation  Pulmonary exam normal       Cardiovascular hypertension, Pt. on medications and Pt. on home beta blockers + Valvular Problems/Murmurs Rhythm:Regular Rate:Normal     Neuro/Psych Anxiety negative neurological ROS     GI/Hepatic negative GI ROS, Neg liver ROS,   Endo/Other  negative endocrine ROS  Renal/GU negative Renal ROS  negative genitourinary   Musculoskeletal negative musculoskeletal ROS (+)   Abdominal   Peds  Hematology negative hematology ROS (+)   Anesthesia Other Findings   Reproductive/Obstetrics Bilateral ovarian cysts Bilateral hydrosalphinx Hx/ bilateral TOA                           Anesthesia Physical Anesthesia Plan  ASA: II  Anesthesia Plan: General   Post-op Pain Management:    Induction: Intravenous  Airway Management Planned: Oral ETT  Additional Equipment:   Intra-op Plan:   Post-operative Plan: Extubation in OR  Informed Consent: I have reviewed the patients History and Physical, chart, labs and discussed the procedure including the risks, benefits and alternatives for the proposed anesthesia with the patient or authorized representative who has indicated his/her understanding and acceptance.   Dental advisory given  Plan Discussed with: CRNA, Anesthesiologist and Surgeon  Anesthesia Plan Comments:         Anesthesia Quick Evaluation

## 2013-10-25 NOTE — Progress Notes (Signed)
Dr Willa Fraterarrigan in to evaluate patient, recommends her stay for observation instead of discharge home.  Dr. Louanna Rawonstants called and order given to transfer patient to third floor for observation.  19:30 vitals taken patient assessed and transferred to the floor.  Report given to Helmut MusterAlicia, Charity fundraiserN.

## 2013-10-26 NOTE — Discharge Summary (Signed)
Physician Discharge Summary  Patient ID: Jill Wright MRN: [id number] DOB/AGE: 42/09/1970 42 y.o.  Admit date: 10/25/2013 Discharge date: 10/26/2013  Admission Diagnoses: Post-op abdominal pain and heavy sedation  Discharge Diagnoses:  Active Problems:   S/P BSO (bilateral salpingo-oophorectomy)   S/P laparoscopic procedure   Discharged Condition: good  Hospital Course: Patient underwent a scheduled uncomplicated laparoscopic BSO with Dr. Shawnie PonsPratt. While in the PACU, patient was heavily medicated with pain medication resulting in heavy sedation preventing her to be discharged safely. Patient was admitted for observation overnight and did well. This morning, she reports some mild abdominal pain. She is tolerated her diet, is ambulating and is passing gas  Consults: None  Significant Diagnostic Studies: none  Treatments: observation  Discharge Exam: Blood pressure 117/83, pulse 56, temperature 97.5 F (36.4 C), temperature source Oral, resp. rate 16, height 6\' 1"  (1.854 m), weight 221 lb (100.245 kg), SpO2 96.00%. General appearance: alert, cooperative and no distress Resp: clear to auscultation bilaterally Cardio: regular rate and rhythm GI: soft, appropriate diffuse mild tenderness, non distended. + bowel sounds Extremities: Homans sign is negative, no sign of DVT  Disposition: 01-Home or Self Care  Discharge Orders   Future Orders Complete By Expires   Diet - low sodium heart healthy  As directed    Discharge wound care:  As directed    Increase activity slowly  As directed    Lifting restrictions  As directed    Sexual Activity Restrictions  As directed        Medication List    STOP taking these medications       ciprofloxacin 500 MG tablet  Commonly known as:  CIPRO      TAKE these medications       albuterol 108 (90 BASE) MCG/ACT inhaler  Commonly known as:  PROVENTIL HFA;VENTOLIN HFA  Inhale 2 puffs into the lungs every 6 (six) hours as needed for  wheezing or shortness of breath.     atenolol-chlorthalidone 100-25 MG per tablet  Commonly known as:  TENORETIC  Take 1 tablet by mouth daily.     nebivolol 5 MG tablet  Commonly known as:  BYSTOLIC  Take 5 mg by mouth daily.     oxyCODONE-acetaminophen 5-325 MG per tablet  Commonly known as:  PERCOCET/ROXICET  Take 1-2 tablets by mouth every 6 (six) hours as needed.           Follow-up Information   [facility] ([facility] check)    Specialty:  Obstetrics and Gynecology   Contact information:   [address] [place] [address] [phone]      Signed: Catalina Antiguaeggy Salene Mohamud 10/26/2013, 7:14 AM

## 2013-10-26 NOTE — Progress Notes (Signed)
Discharge instructions reviewed with patient.  Patient states understanding of home care.  No home equipment needed.  Patient ambulated for discharge in stable condition with staff without incident. 

## 2013-10-26 NOTE — Anesthesia Postprocedure Evaluation (Signed)
  Anesthesia Post-op Note  Patient: Jill Wright [name]  Procedure(s) Performed: Procedure(s): LAPAROSCOPIC BILATERAL SALPINGO OOPHORECTOMY  (Bilateral)  Patient Location: [facility]  Anesthesia Type:General  Level of Consciousness: awake, alert  and oriented  Airway and Oxygen Therapy: Patient Spontanous Breathing  Post-op Pain: none  Post-op Assessment: Post-op Vital signs reviewed, Patient's Cardiovascular Status Stable and Respiratory Function Stable  Post-op Vital Signs: Reviewed and stable  Last Vitals:  Filed Vitals:   10/26/13 0507  BP: 117/83  Pulse: 56  Temp: 36.4 C  Resp: 16    Complications: No apparent anesthesia complications

## 2013-10-26 NOTE — Addendum Note (Signed)
Addendum created 10/26/13 [id number] by Shanon PayorSuzanne M Herald Vallin, CRNA   Modules edited: Notes Section   Notes Section:  File: [id number]

## 2013-10-28 ENCOUNTER — Telehealth: Payer: Self-pay | Admitting: *Deleted

## 2013-10-28 ENCOUNTER — Encounter (HOSPITAL_COMMUNITY): Payer: Self-pay | Admitting: Family Medicine

## 2013-10-28 NOTE — Telephone Encounter (Signed)
Pharmacy called nurse line for return phone call concerning Percocet prescription.  Patient also called nurse line to discuss prescription at the pharmacy.  [facility] and he questioned amount of pills in past 60 days 180 Percocet and 30 days 100 percocet, reviewed history and informed she had surgery on Friday and needed the prescription.  Contacted patient and informed that the pharmacy would fill prescription since she had surgery.  Pt verbalizes understanding.

## 2013-10-29 ENCOUNTER — Encounter: Payer: Self-pay | Admitting: Family Medicine

## 2013-11-04 ENCOUNTER — Encounter (HOSPITAL_COMMUNITY): Payer: Self-pay | Admitting: Emergency Medicine

## 2013-11-04 ENCOUNTER — Emergency Department (HOSPITAL_COMMUNITY)
Admission: EM | Admit: 2013-11-04 | Discharge: 2013-11-05 | Disposition: A | Discharge status: Home / Self Care | Payer: Self-pay | Attending: Emergency Medicine | Admitting: Emergency Medicine

## 2013-11-04 DIAGNOSIS — R011 Cardiac murmur, unspecified: Secondary | ICD-10-CM | POA: Insufficient documentation

## 2013-11-04 DIAGNOSIS — M25519 Pain in unspecified shoulder: Secondary | ICD-10-CM | POA: Insufficient documentation

## 2013-11-04 DIAGNOSIS — R071 Chest pain on breathing: Secondary | ICD-10-CM | POA: Insufficient documentation

## 2013-11-04 DIAGNOSIS — Z8659 Personal history of other mental and behavioral disorders: Secondary | ICD-10-CM | POA: Insufficient documentation

## 2013-11-04 DIAGNOSIS — I10 Essential (primary) hypertension: Secondary | ICD-10-CM | POA: Insufficient documentation

## 2013-11-04 DIAGNOSIS — J45909 Unspecified asthma, uncomplicated: Secondary | ICD-10-CM | POA: Insufficient documentation

## 2013-11-04 DIAGNOSIS — Z79899 Other long term (current) drug therapy: Secondary | ICD-10-CM | POA: Insufficient documentation

## 2013-11-04 DIAGNOSIS — F17200 Nicotine dependence, unspecified, uncomplicated: Secondary | ICD-10-CM | POA: Insufficient documentation

## 2013-11-04 DIAGNOSIS — Z8742 Personal history of other diseases of the female genital tract: Secondary | ICD-10-CM | POA: Insufficient documentation

## 2013-11-04 DIAGNOSIS — R0789 Other chest pain: Secondary | ICD-10-CM

## 2013-11-04 LAB — BASIC METABOLIC PANEL
BUN: 8 mg/dL (ref 6–23)
CO2: 27 meq/L (ref 19–32)
Calcium: 8.5 mg/dL (ref 8.4–10.5)
Chloride: 104 mEq/L (ref 96–112)
Creatinine, Ser: 0.54 mg/dL (ref 0.50–1.10)
GFR calc non Af Amer: >90 mL/min (ref >90)
Glucose, Bld: 91 mg/dL (ref 70–99)
POTASSIUM: 3.3 meq/L — AB (ref 3.7–5.3)
Sodium: 141 mEq/L (ref 137–147)

## 2013-11-04 LAB — CBC
HCT: 37.8 % (ref 36.0–46.0)
Hemoglobin: 12.5 g/dL (ref 12.0–15.0)
MCH: 29.8 pg (ref 26.0–34.0)
MCHC: 33.1 g/dL (ref 30.0–36.0)
MCV: 90.2 fL (ref 78.0–100.0)
PLATELETS: 200 10*3/uL (ref 150–400)
RBC: 4.19 MIL/uL (ref 3.87–5.11)
RDW: 13.4 % (ref 11.5–15.5)
WBC: 10.8 10*3/uL — ABNORMAL HIGH (ref 4.0–10.5)

## 2013-11-04 LAB — I-STAT TROPONIN, ED: Troponin i, poc: 0 ng/mL (ref 0.00–0.08)

## 2013-11-04 MED ORDER — ONDANSETRON HCL 4 MG/2ML IJ SOLN
4.0000 mg | Freq: Once | INTRAMUSCULAR | Status: AC
  Administered 2013-11-05: 4 mg via INTRAVENOUS
  Filled 2013-11-04: qty 2

## 2013-11-04 MED ORDER — HYDROMORPHONE HCL PF 1 MG/ML IJ SOLN
1.0000 mg | Freq: Once | INTRAMUSCULAR | Status: AC
  Administered 2013-11-05: 1 mg via INTRAVENOUS
  Filled 2013-11-04: qty 1

## 2013-11-04 MED ORDER — SODIUM CHLORIDE 0.9 % IV SOLN
INTRAVENOUS | Status: DC
  Administered 2013-11-05: via INTRAVENOUS

## 2013-11-04 NOTE — ED Notes (Signed)
Pt in c/o left shoulder pain x1 month since having her ovaries removed, was told this was a normal post op problem and it would resolve but it hasn't, pain is worse with movement, today while walking outside patient also developed left sided chest pain, pain is reproducible with palpation and is worse with movement. Pt states she has been taking percocet at home for this pain and she took her last one today. No distress noted at this time. Denies other symptoms.

## 2013-11-04 NOTE — ED Provider Notes (Signed)
CSN: [id number]     Arrival date & time 11/04/13  2219 History   First MD Initiated Contact with Patient 11/04/13 2304     Chief Complaint  Patient presents with  . Chest Pain  . Shoulder Pain     (Consider location/radiation/quality/duration/timing/severity/associated sxs/prior Treatment) The history is provided by the patient.   43 year old female with a complaint of left-sided chest pain at present for about 3-4 days. Became worse at 8:00 in the evening. With movement is 10 out of 10 otherwise there's no pain. Any movement of the left arm causes pain. Pain radiates up into her left shoulder area. Not associated with shortness of breath not associated with any nausea or vomiting. Patient's had the complaint in the past of left shoulder pain which been ongoing for about a month. Patient with recent surgery for hemorrhagic ovarian cyst.  Past Medical History  Diagnosis Date  . Hypertension   . Anxiety   . Ovarian cyst   . Heart murmur     at birth - no problems as adult  . Asthma     rescue - occasional uses inhaler  . History of blood transfusion     in [place] - unsure of number of units transfused - "Pt states yrs ago"   Past Surgical History  Procedure Laterality Date  . Appendectomy    . Cesarean section  1995    x  1  . Abdominal hysterectomy    . Wisdom tooth extraction    . Laparoscopic bilateral salpingo oopherectomy Bilateral 10/25/2013    Procedure: LAPAROSCOPIC BILATERAL SALPINGO OOPHORECTOMY ;  Surgeon: Reva Boresanya S Pratt, MD;  Location: [facility];  Service: Gynecology;  Laterality: Bilateral;   Family History  Problem Relation Age of Onset  . Cancer Mother [id number]    breast  . Hypertension Mother   . Heart disease Father   . Stroke Father   . Arthritis Daughter   . Seizures Son   . Hypertension Maternal Grandmother   . Arthritis Daughter    History  Substance Use Topics  . Smoking status: Current Some Day Smoker -- 0.50 packs/day for 5 years    Types: Cigarettes  .  Smokeless tobacco: Never Used  . Alcohol Use: No   OB History   Grav Para Term Preterm Abortions TAB SAB Ect Mult Living   3 3 3       2      Review of Systems  Constitutional: Negative for fever.  HENT: Negative for congestion.   Eyes: Negative for visual disturbance.  Respiratory: Negative for shortness of breath.   Cardiovascular: Positive for chest pain.  Gastrointestinal: Negative for abdominal pain.  Genitourinary: Negative for dysuria.  Musculoskeletal: Negative for back pain.  Skin: Negative for rash.  Neurological: Negative for headaches.  Hematological: Does not bruise/bleed easily.  Psychiatric/Behavioral: Negative for confusion.      Allergies  Lisinopril; Asa; and Ultram  Home Medications   Prior to Admission medications   Medication Sig Start Date End Date Taking? Authorizing Provider  albuterol (PROVENTIL HFA;VENTOLIN HFA) 108 (90 BASE) MCG/ACT inhaler Inhale 2 puffs into the lungs every 6 (six) hours as needed for wheezing or shortness of breath.   Yes Historical Provider, MD  atenolol-chlorthalidone (TENORETIC) 100-25 MG per tablet Take 1 tablet by mouth daily.   Yes Historical Provider, MD  nebivolol (BYSTOLIC) 5 MG tablet Take 5 mg by mouth daily.   Yes Historical Provider, MD  oxyCODONE-acetaminophen (PERCOCET/ROXICET) 5-325 MG per tablet Take 2 tablets  by mouth every 4 (four) hours as needed for moderate pain or severe pain.   Yes Historical Provider, MD   BP 135/87  Pulse 70  Temp(Src) 98.5 F (36.9 C) (Oral)  Resp 22  Wt 225 lb (102.059 kg)  SpO2 90% Physical Exam  Nursing note and vitals reviewed. Constitutional: She is oriented to person, place, and time. She appears well-developed and well-nourished. No distress.  HENT:  Head: Normocephalic and atraumatic.  Mouth/Throat: Oropharynx is clear and moist.  Eyes: Conjunctivae and EOM are normal. Pupils are equal, round, and reactive to light.  Neck: Normal range of motion.  Cardiovascular:  Normal rate and regular rhythm.   Pulmonary/Chest: Effort normal and breath sounds normal. No respiratory distress. She exhibits tenderness.  Reproducible chest pain 2 left-sided chest. Also made worse with movement of the left arm.  Abdominal: Soft. Bowel sounds are normal. There is no tenderness.  Musculoskeletal: Normal range of motion.  The shoulder without any deformity no evidence dislocation radial pulse in the left arm distally 2+.  Neurological: She is alert and oriented to person, place, and time. No cranial nerve deficit. She exhibits normal muscle tone. Coordination normal.  Skin: Skin is warm.    ED Course  Procedures (including critical care time) Labs Review Labs Reviewed  CBC - Abnormal; Notable for the following:    WBC 10.8 (*)    All other components within normal limits  BASIC METABOLIC PANEL - Abnormal; Notable for the following:    Potassium 3.3 (*)    All other components within normal limits  I-STAT TROPOININ, ED  I-STAT TROPOININ, ED   Results for orders placed during the hospital encounter of 11/04/13  CBC      Result Value Ref Range   WBC 10.8 (*) 4.0 - 10.5 K/uL   RBC 4.19  3.87 - 5.11 MIL/uL   Hemoglobin 12.5  12.0 - 15.0 g/dL   HCT 16.1  09.6 - 04.5 %   MCV 90.2  78.0 - 100.0 fL   MCH 29.8  26.0 - 34.0 pg   MCHC 33.1  30.0 - 36.0 g/dL   RDW 40.9  81.1 - 91.4 %   Platelets 200  150 - 400 K/uL  BASIC METABOLIC PANEL      Result Value Ref Range   Sodium 141  137 - 147 mEq/L   Potassium 3.3 (*) 3.7 - 5.3 mEq/L   Chloride 104  96 - 112 mEq/L   CO2 27  19 - 32 mEq/L   Glucose, Bld 91  70 - 99 mg/dL   BUN 8  6 - 23 mg/dL   Creatinine, Ser 7.82  0.50 - 1.10 mg/dL   Calcium 8.5  8.4 - 95.6 mg/dL   GFR calc non Af Amer >90  >90 mL/min   GFR calc Af Amer >90  >90 mL/min  I-STAT TROPOININ, ED      Result Value Ref Range   Troponin i, poc 0.00  0.00 - 0.08 ng/mL   Comment 3           I-STAT TROPOININ, ED      Result Value Ref Range   Troponin i,  poc 0.01  0.00 - 0.08 ng/mL   Comment 3            Results for orders placed during the hospital encounter of 11/04/13  CBC      Result Value Ref Range   WBC 10.8 (*) 4.0 - 10.5 K/uL   RBC 4.19  3.87 - 5.11 MIL/uL   Hemoglobin 12.5  12.0 - 15.0 g/dL   HCT 91.437.8  78.236.0 - 95.646.0 %   MCV 90.2  78.0 - 100.0 fL   MCH 29.8  26.0 - 34.0 pg   MCHC 33.1  30.0 - 36.0 g/dL   RDW 21.313.4  08.611.5 - 57.815.5 %   Platelets 200  150 - 400 K/uL  BASIC METABOLIC PANEL      Result Value Ref Range   Sodium 141  137 - 147 mEq/L   Potassium 3.3 (*) 3.7 - 5.3 mEq/L   Chloride 104  96 - 112 mEq/L   CO2 27  19 - 32 mEq/L   Glucose, Bld 91  70 - 99 mg/dL   BUN 8  6 - 23 mg/dL   Creatinine, Ser 4.690.54  0.50 - 1.10 mg/dL   Calcium 8.5  8.4 - 62.910.5 mg/dL   GFR calc non Af Amer >90  >90 mL/min   GFR calc Af Amer >90  >90 mL/min  I-STAT TROPOININ, ED      Result Value Ref Range   Troponin i, poc 0.00  0.00 - 0.08 ng/mL   Comment 3           I-STAT TROPOININ, ED      Result Value Ref Range   Troponin i, poc 0.01  0.00 - 0.08 ng/mL   Comment 3              Imaging Review Dg Chest 2 View  11/05/2013   CLINICAL DATA:  Chest pain  EXAM: CHEST  2 VIEW  COMPARISON:  None.  FINDINGS: The heart size and mediastinal contours are within normal limits. Both lungs are clear. The visualized skeletal structures are unremarkable.  IMPRESSION: No active cardiopulmonary disease.   Electronically Signed   By: Alcide CleverMark  Lukens M.D.   On: 11/05/2013 00:45   Ct Angio Chest Pe W/cm &/or Wo Cm  11/05/2013   CLINICAL DATA:  Left shoulder pain for 1 month. Pain is worse with movement. Now with left-sided chest pain.  EXAM: CT ANGIOGRAPHY CHEST WITH CONTRAST  TECHNIQUE: Multidetector CT imaging of the chest was performed using the standard protocol during bolus administration of intravenous contrast. Multiplanar CT image reconstructions and MIPs were obtained to evaluate the vascular anatomy.  CONTRAST:  100mL OMNIPAQUE IOHEXOL 350 MG/ML SOLN   COMPARISON:  DG CHEST 2 VIEW dated 11/05/2013  FINDINGS: Technically adequate study with good opacification of the central and segmental pulmonary arteries. No focal filling defects are demonstrated. No evidence of significant pulmonary embolus.  Normal heart size. Normal caliber thoracic aorta. No evidence of aortic dissection. Esophagus is decompressed. Small esophageal hiatal hernia. No significant lymphadenopathy in the chest. No pleural effusions. Visualized portions of the upper abdominal organs demonstrate renal cysts.  Patchy mosaic changes in the lung bases may reflect motion artifact or air trapping. No focal consolidation or airspace disease. No pneumothorax. Airways appear patent. Normal alignment of the thoracic spine. No displaced spinal or rib fractures identified.  Review of the MIP images confirms the above findings.  IMPRESSION: No evidence of significant pulmonary embolus.   Electronically Signed   By: Burman NievesWilliam  Stevens M.D.   On: 11/05/2013 01:38     EKG Interpretation None       Date: 11/04/2013  Rate: 71  Rhythm: normal sinus rhythm  QRS Axis: normal  Intervals: normal  ST/T Wave abnormalities: nonspecific T wave changes  Conduction Disutrbances:none  Narrative Interpretation:   Old  EKG Reviewed: none available    MDM   Final diagnoses:  Chest wall pain   Patient assistant left-sided chest wall pain. Troponin not x2 negative. Chest x-ray negative CT angios negative for pulmonary embolus or any other acute pulmonary problems. Pain worse with movement. Pain not distinct the shoulder but more of left chest and tender to palpation there will treat his chest wall pain. Patient is waiting to given to the wellness clinic for followup. Patient nontoxic no acute distress.     Shelda Jakes, MD 11/05/13 [phone]

## 2013-11-05 ENCOUNTER — Emergency Department (HOSPITAL_COMMUNITY): Payer: Self-pay

## 2013-11-05 LAB — I-STAT TROPONIN, ED: TROPONIN I, POC: 0.01 ng/mL (ref 0.00–0.08)

## 2013-11-05 MED ORDER — IOHEXOL 350 MG/ML SOLN
100.0000 mL | Freq: Once | INTRAVENOUS | Status: AC | PRN
  Administered 2013-11-05: 100 mL via INTRAVENOUS

## 2013-11-05 MED ORDER — HYDROCODONE-ACETAMINOPHEN 5-325 MG PO TABS: 1.0000 | ORAL_TABLET | Freq: Four times a day (QID) | ORAL | Status: AC | PRN

## 2013-11-05 NOTE — Discharge Instructions (Signed)

## 2013-11-05 NOTE — ED Notes (Signed)
MD at bedside to update patient

## 2013-11-11 ENCOUNTER — Ambulatory Visit: Payer: Self-pay | Admitting: Family Medicine

## 2014-04-03 IMAGING — CR DG ANKLE COMPLETE 3+V*R*
3 series · 3 of 3 positions shown · non-contrast
Comparison: None.

CLINICAL DATA: History of fall

EXAM:
RIGHT ANKLE - COMPLETE 3+ VIEW

[view not recorded (1 of 3)]
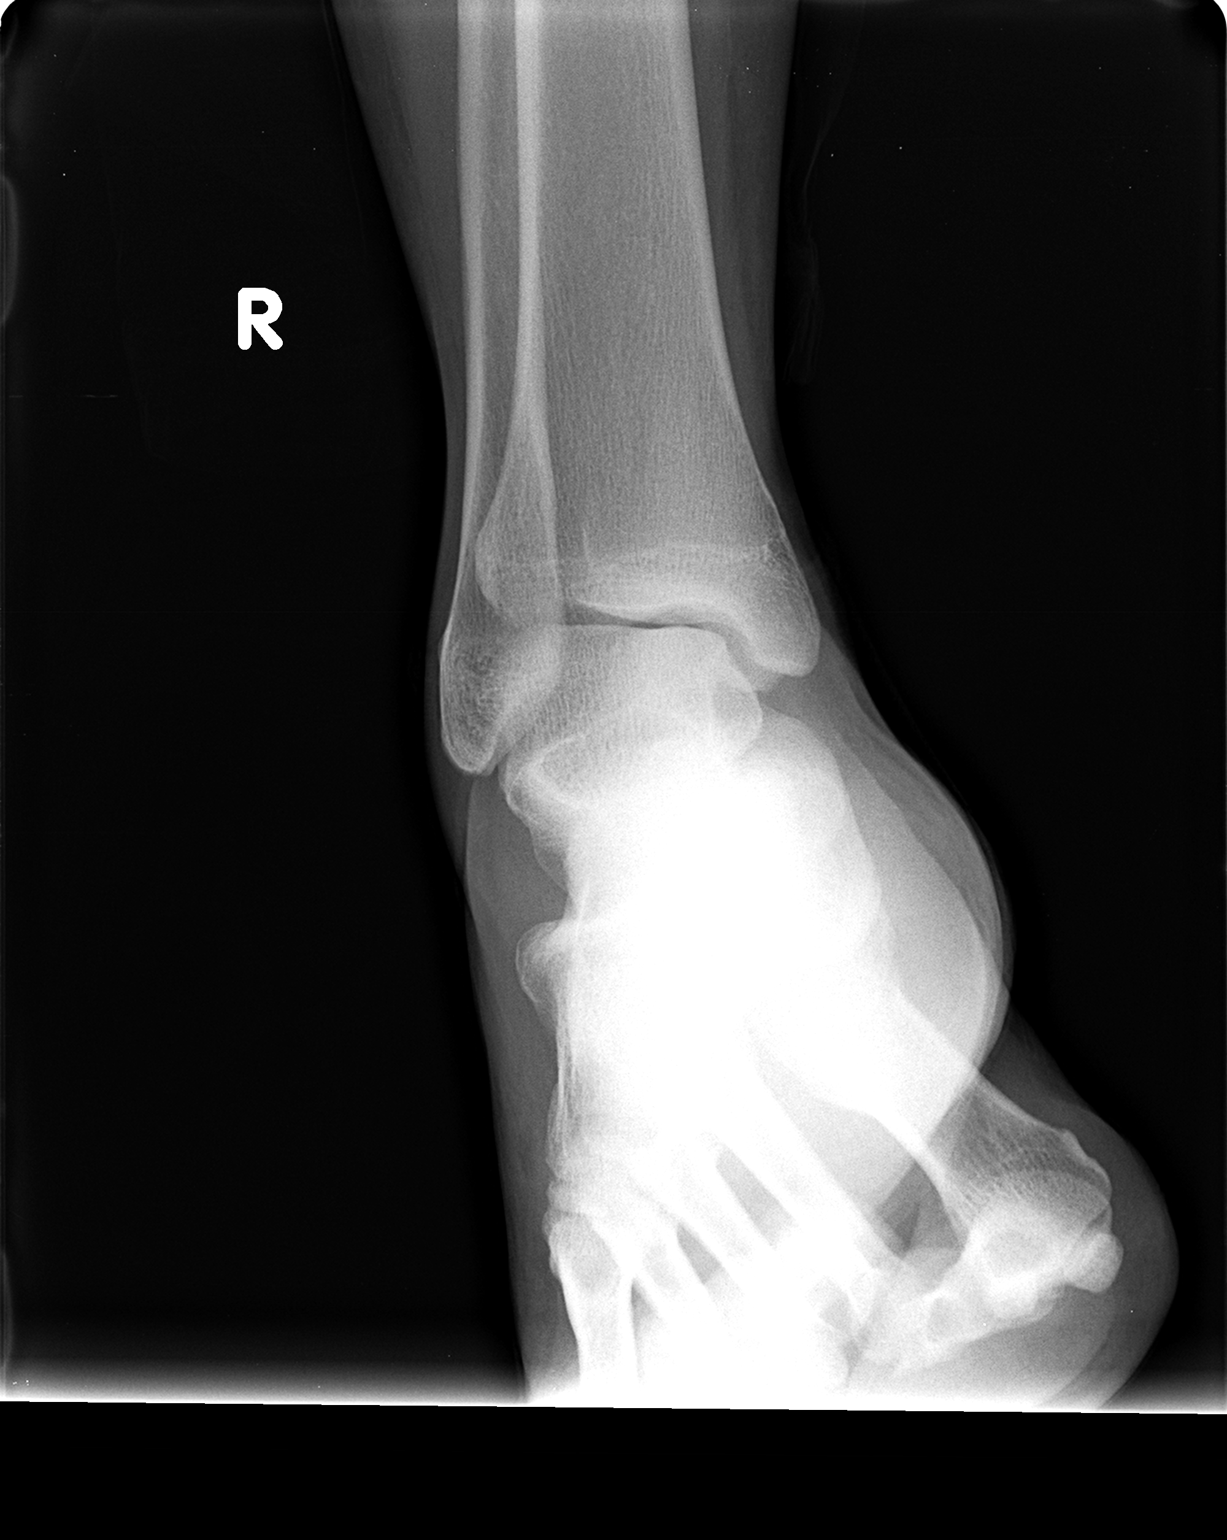

[view not recorded (2 of 3)]
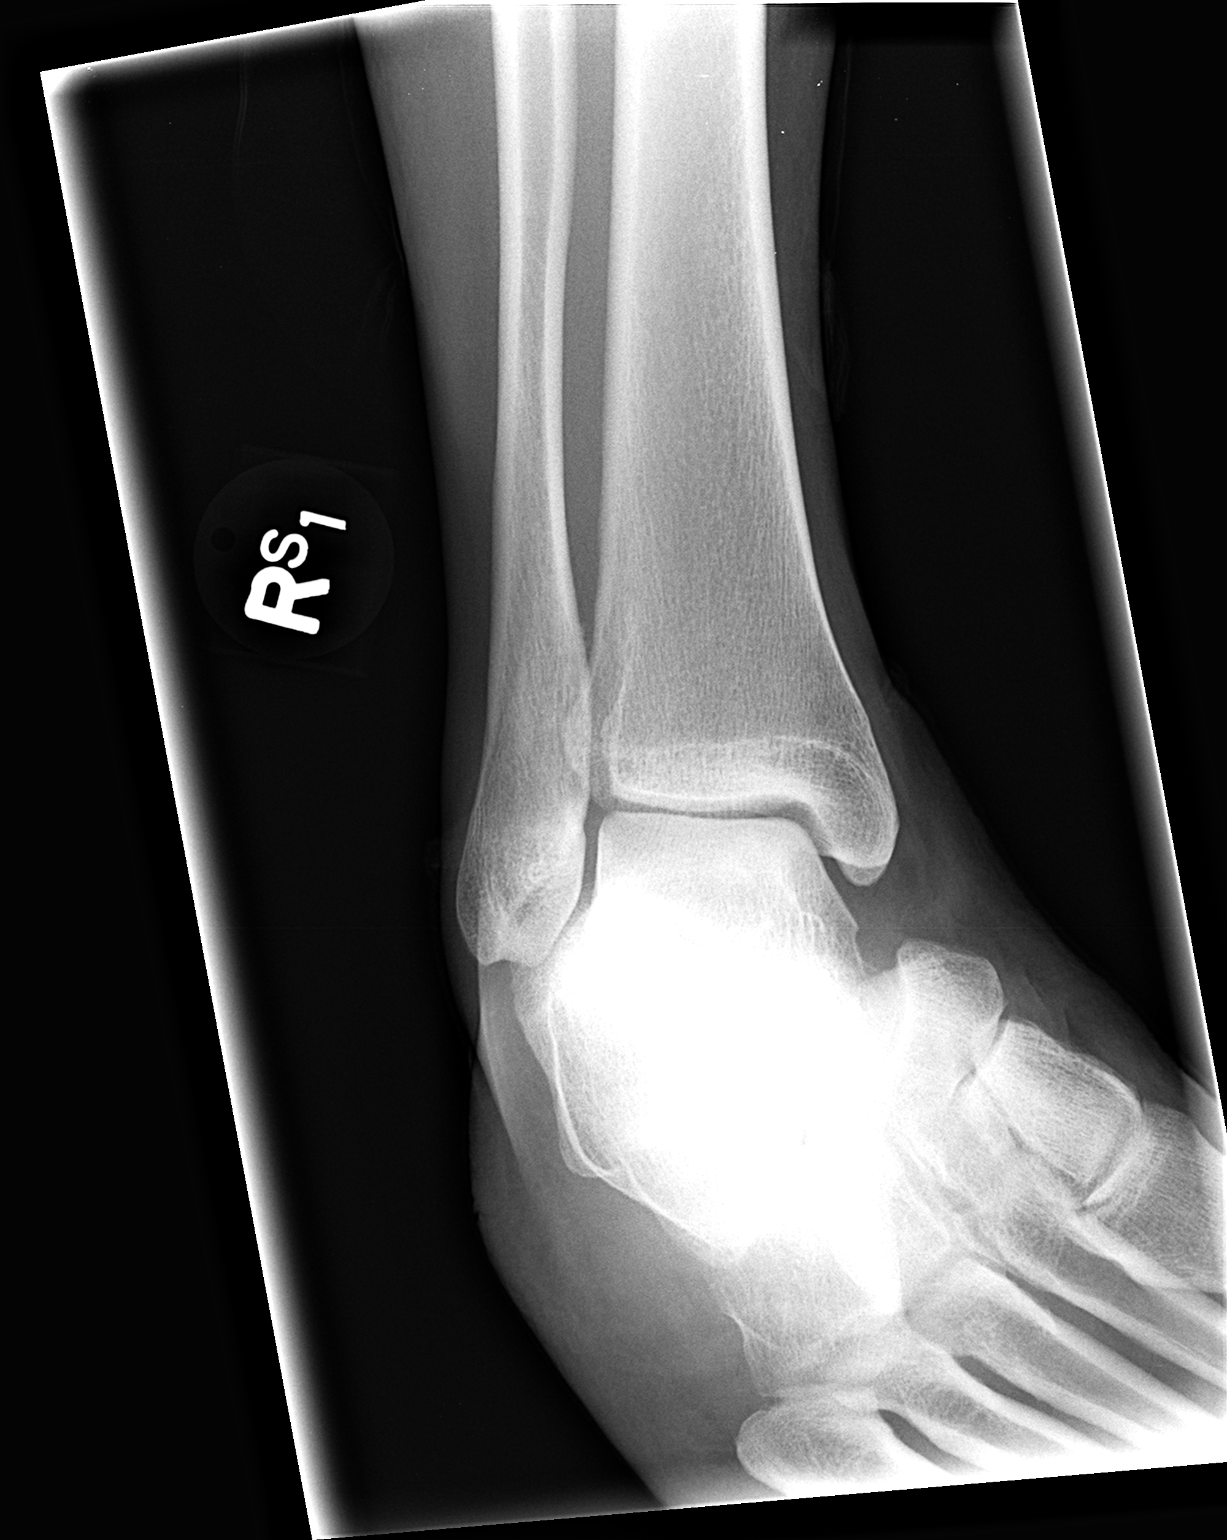

[view not recorded (3 of 3)]
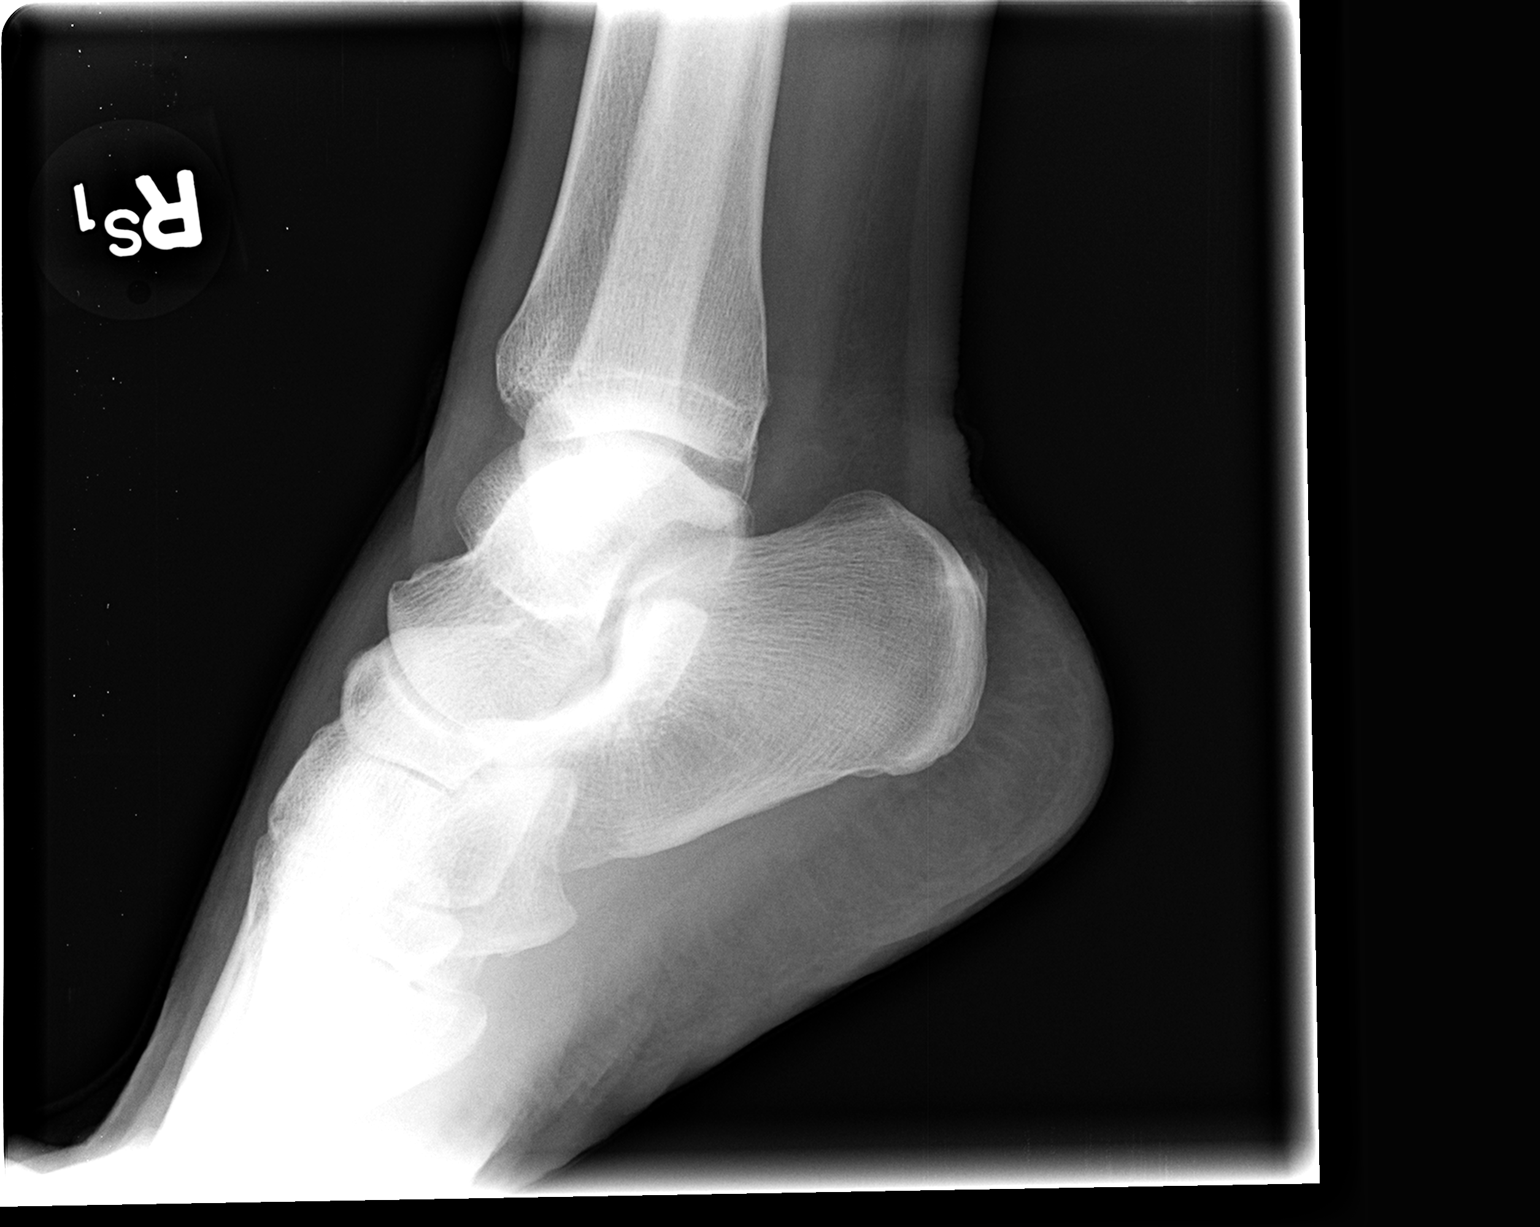

[3 of 3 positions shown; findings below may reference images not displayed]

FINDINGS: There is no evidence of fracture, dislocation, or joint effusion.
There is no evidence of arthropathy or other focal bone abnormality.
Soft tissues are unremarkable.
IMPRESSION: Negative.

## 2014-04-28 ENCOUNTER — Encounter (HOSPITAL_COMMUNITY): Payer: Self-pay | Admitting: Emergency Medicine

## 2014-08-02 IMAGING — CT CT IMAGE GUIDED DRAINAGE BY PERCUTANEOUS CATHETER
1 of 2 series · 14 of 32 positions shown, 19 images · non-contrast
Comparison: none

CLINICAL DATA: Pelvic pain, complex fluid collections by CT
concerning for tubo-ovarian abscess

[Series 2: i-spiral 5.0 b40f · axial · 0.90mm/px · z∈[-403,-270]mm · 14 of 42 slices shown, 19 images]
[im 2/42  soft-tissue]
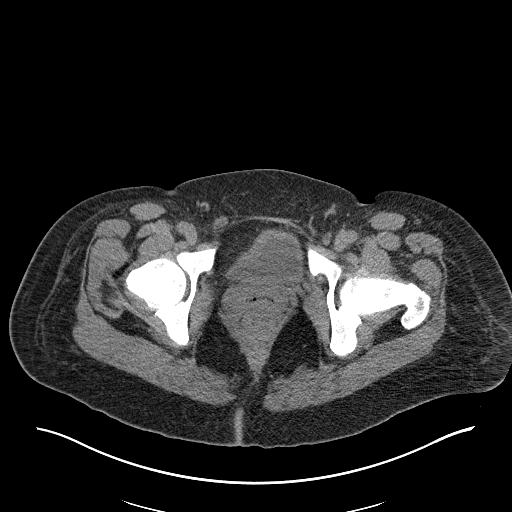
[im 2/42  bone]
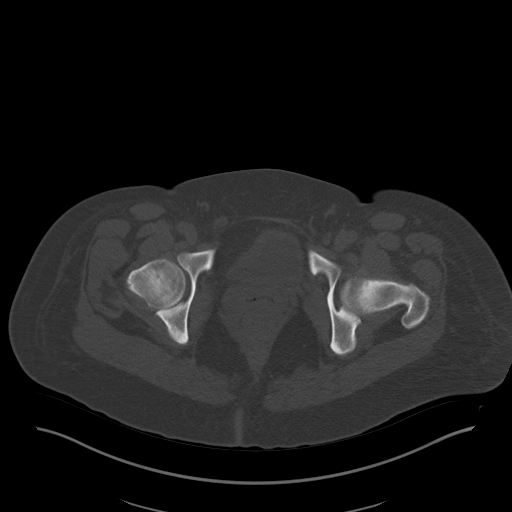
[im 6/42  soft-tissue]
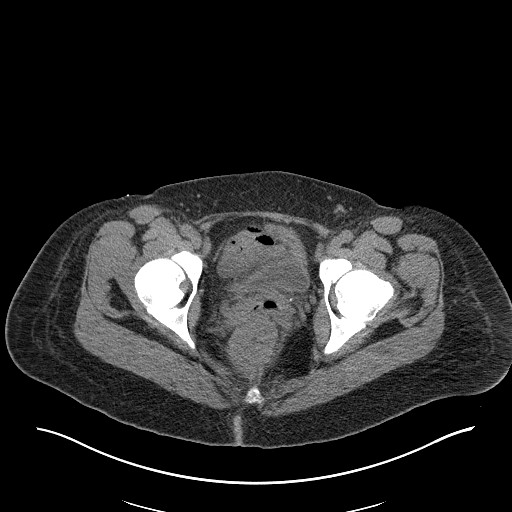
[im 8/42  soft-tissue]
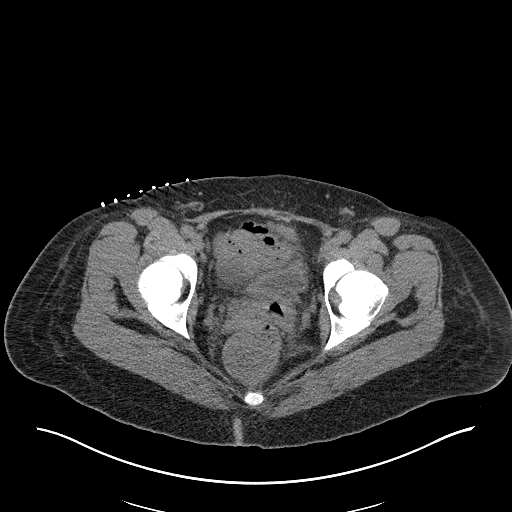
[im 12/42  soft-tissue]
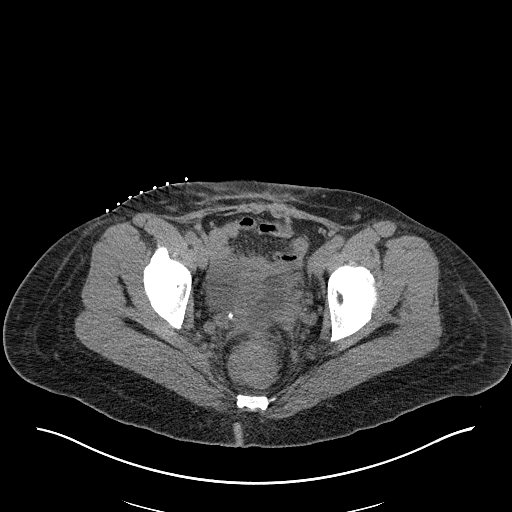
[im 14/42  soft-tissue]
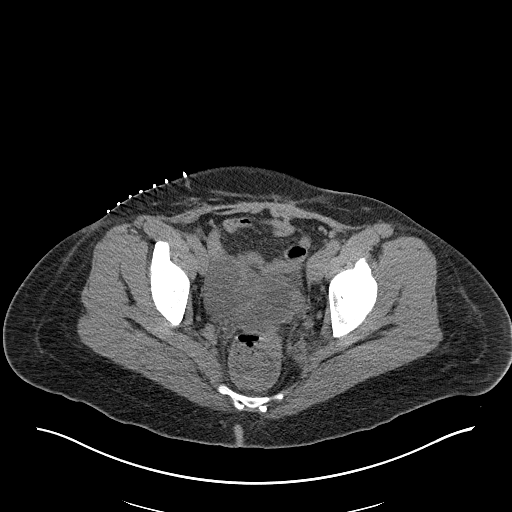
[im 18/42  soft-tissue]
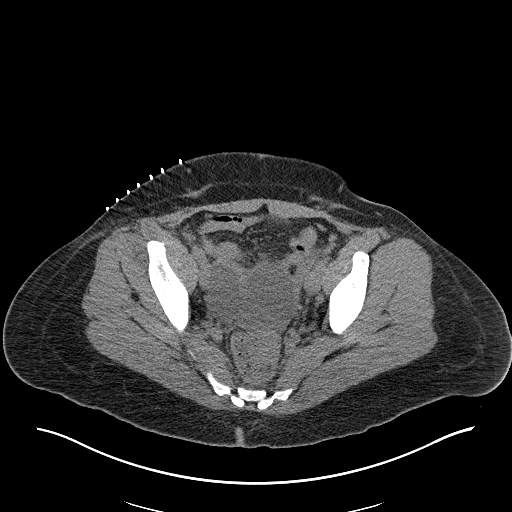
[im 22/42  soft-tissue]
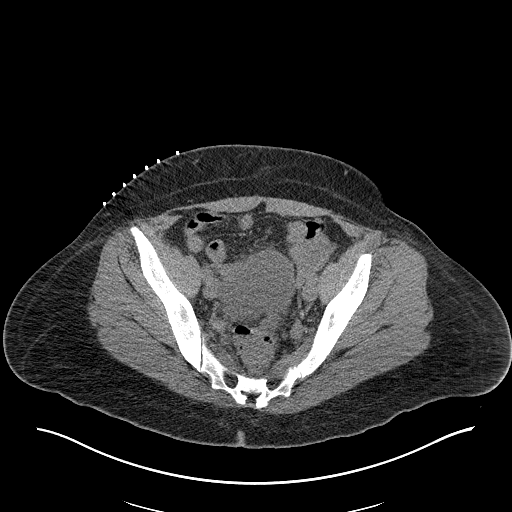
[im 24/42  soft-tissue]
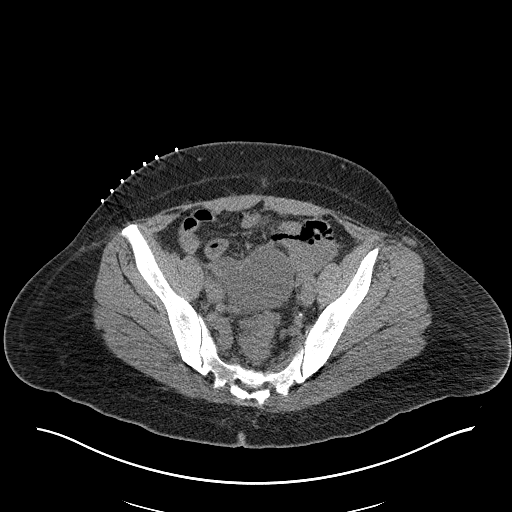
[im 28/42  soft-tissue]
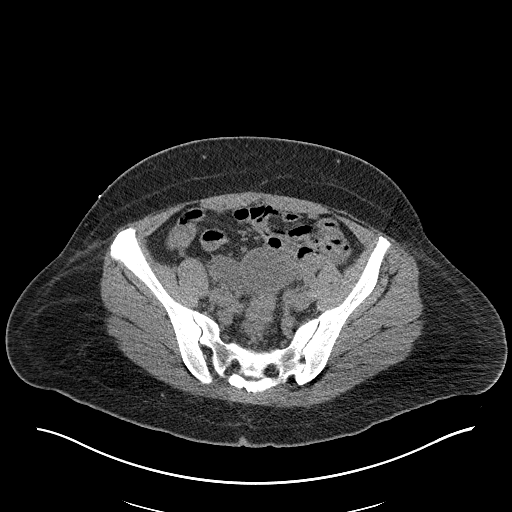
[im 28/42  bone]
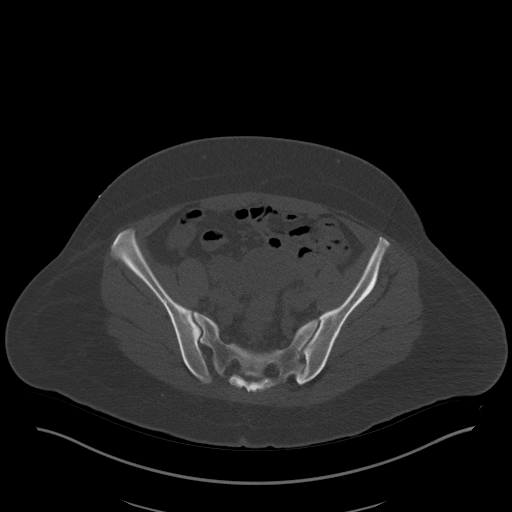
[im 30/42  soft-tissue]
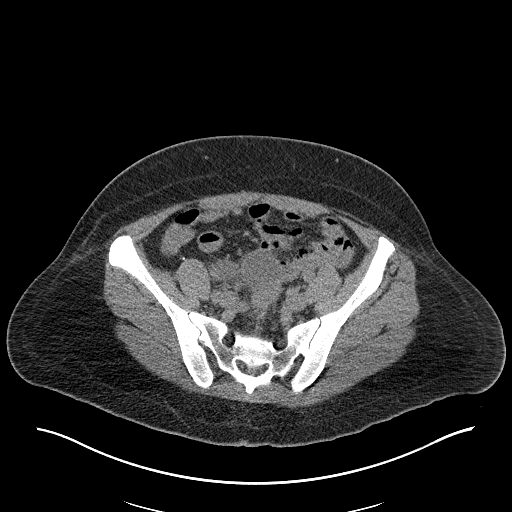
[im 34/42  soft-tissue]
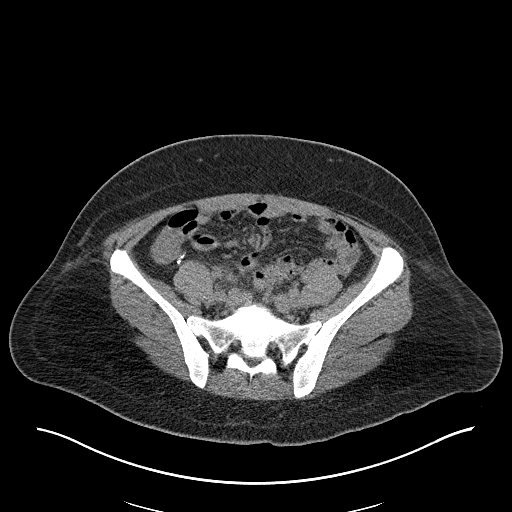
[im 34/42  lung]
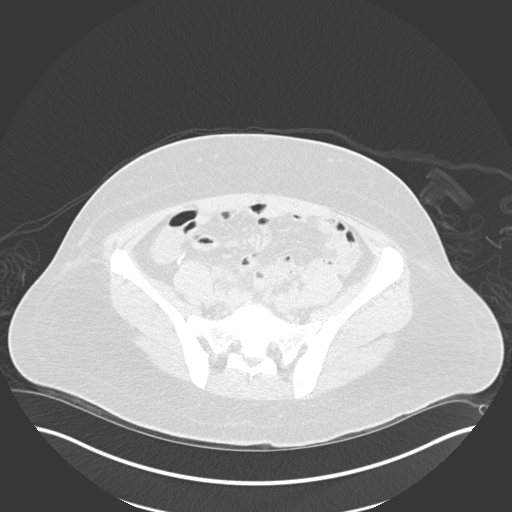
[im 36/42  soft-tissue]
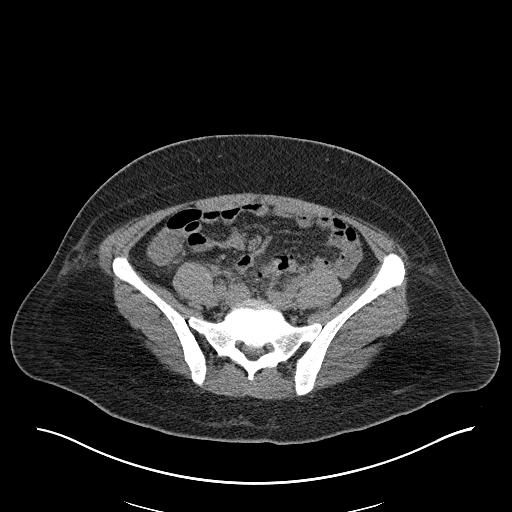
[im 36/42  lung]
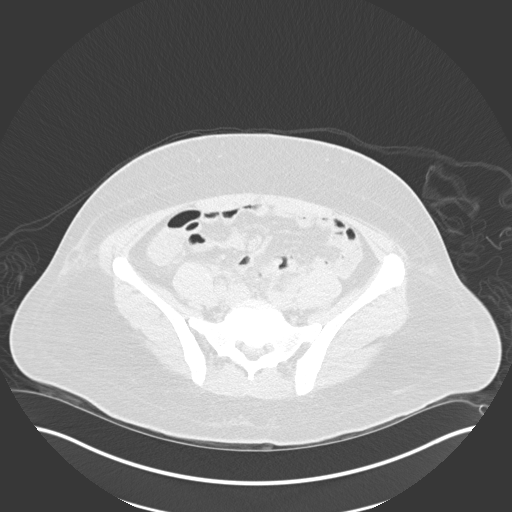
[im 38/42  lung]
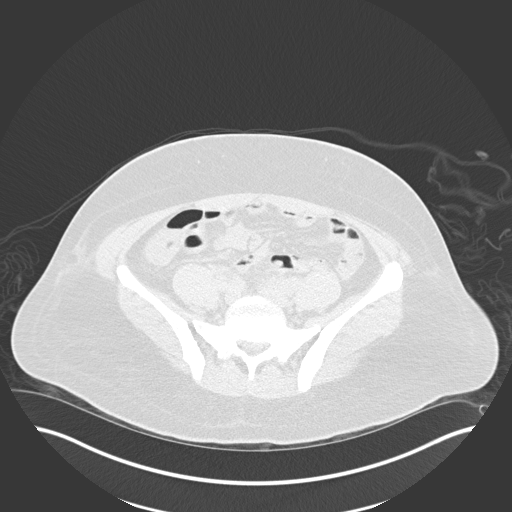
[im 40/42  soft-tissue]
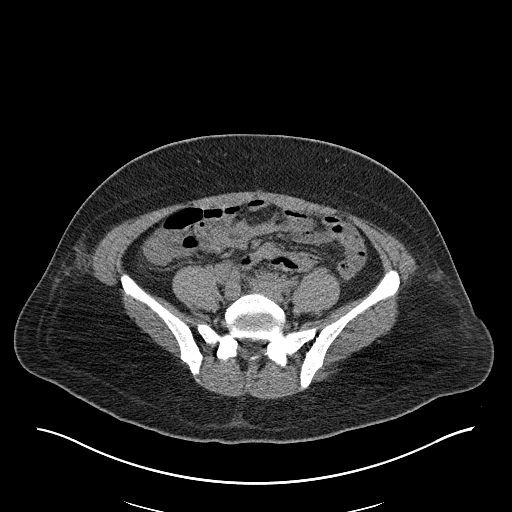
[im 40/42  lung]
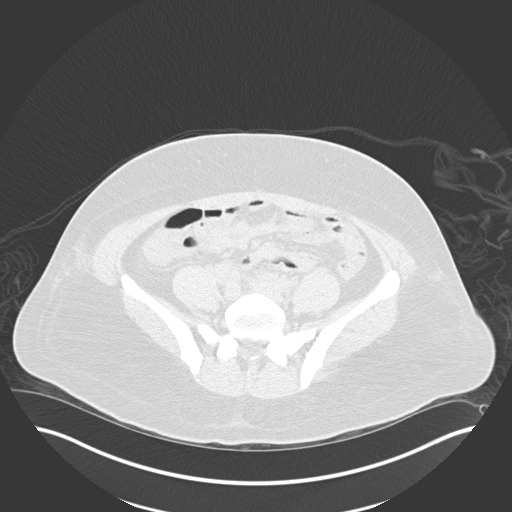

[14 of 32 positions shown; findings below may reference images not displayed]

EXAM:
CT-GUIDED RIGHT PELVIC FLUID COLLECTION NEEDLE ASPIRATION

Date:  [DATE] [DATE]

Radiologist:  Tiger, Macho

Guidance:  CT

FLUOROSCOPY TIME:  None.

MEDICATIONS AND MEDICAL HISTORY:
4 mg Versed, 150 mcg fentanyl

ANESTHESIA/SEDATION:
10 min

CONTRAST:  None.

COMPLICATIONS:
No immediate

PROCEDURE:
Informed consent was obtained from the patient following explanation
of the procedure, risks, benefits and alternatives. The patient
understands, agrees and consents for the procedure. All questions
were addressed. A time out was performed.

Maximal barrier sterile technique utilized including caps, mask,
sterile gowns, sterile gloves, large sterile drape, hand hygiene,
and Betadine.

Previous imaging reviewed. Patient positioned supine. Noncontrast
localization CT performed through the pelvis. The complex pelvic
fluid collection was localized. Under sterile conditions and local
anesthesia, a 17 gauge 11.8 cm access needle was advanced from a
right anterior oblique approach into the right pelvic fluid
collection. Needle position confirmed with CT. Syringe aspiration
yielded 65 cc yellow serous sanguinous fluid. No purulent component.
No signs of hematoma or definite abscess. Therefore the needle was
removed. No immediate complication. Patient tolerated the procedure
well. Sample sent for Gram stain culture.
IMPRESSION: Successful CT-guided right pelvic fluid collection needle
aspiration.
